# Patient Record
Sex: Male | Born: 1955
Health system: Southern US, Community
[De-identification: ages and names within clinical notes are randomized; demographics above are authoritative.]

## PROBLEM LIST (undated history)

## (undated) DIAGNOSIS — Z8709 Personal history of other diseases of the respiratory system: Secondary | ICD-10-CM

## (undated) DIAGNOSIS — R195 Other fecal abnormalities: Secondary | ICD-10-CM

## (undated) DIAGNOSIS — K440 Diaphragmatic hernia with obstruction, without gangrene: Secondary | ICD-10-CM

## (undated) DIAGNOSIS — K649 Unspecified hemorrhoids: Secondary | ICD-10-CM

## (undated) DIAGNOSIS — E785 Hyperlipidemia, unspecified: Secondary | ICD-10-CM

## (undated) DIAGNOSIS — S5002XA Contusion of left elbow, initial encounter: Secondary | ICD-10-CM

## (undated) DIAGNOSIS — G8929 Other chronic pain: Secondary | ICD-10-CM

## (undated) DIAGNOSIS — J9819 Other pulmonary collapse: Secondary | ICD-10-CM

## (undated) DIAGNOSIS — K219 Gastro-esophageal reflux disease without esophagitis: Secondary | ICD-10-CM

## (undated) DIAGNOSIS — R001 Bradycardia, unspecified: Secondary | ICD-10-CM

## (undated) DIAGNOSIS — Z86010 Personal history of colonic polyps: Secondary | ICD-10-CM

## (undated) DIAGNOSIS — R0789 Other chest pain: Secondary | ICD-10-CM

## (undated) DIAGNOSIS — I10 Essential (primary) hypertension: Secondary | ICD-10-CM

## (undated) HISTORY — DX: Other pulmonary collapse: J98.19

## (undated) HISTORY — DX: Unspecified hemorrhoids: K64.9

## (undated) HISTORY — DX: Personal history of other diseases of the respiratory system: Z87.09

## (undated) HISTORY — DX: Gastro-esophageal reflux disease without esophagitis: K21.9

## (undated) HISTORY — DX: Bradycardia, unspecified: R00.1

---
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
## 2009-02-04 HISTORY — PX: ESOPHAGOGASTRODUODENOSCOPY: SHX1529

## 2009-02-04 HISTORY — PX: COLONOSCOPY: SHX174

## 2015-11-09 DIAGNOSIS — E785 Hyperlipidemia, unspecified: Secondary | ICD-10-CM | POA: Insufficient documentation

## 2015-11-09 DIAGNOSIS — I10 Essential (primary) hypertension: Secondary | ICD-10-CM

## 2015-11-09 DIAGNOSIS — R0789 Other chest pain: Secondary | ICD-10-CM | POA: Insufficient documentation

## 2015-11-09 HISTORY — DX: Hyperlipidemia, unspecified: E78.5

## 2015-11-09 HISTORY — DX: Essential (primary) hypertension: I10

## 2015-11-09 HISTORY — DX: Other chest pain: R07.89

## 2017-01-24 DIAGNOSIS — G8929 Other chronic pain: Secondary | ICD-10-CM | POA: Insufficient documentation

## 2017-01-24 DIAGNOSIS — M25561 Pain in right knee: Secondary | ICD-10-CM

## 2017-01-24 DIAGNOSIS — M25562 Pain in left knee: Secondary | ICD-10-CM

## 2017-01-24 HISTORY — DX: Other chronic pain: G89.29

## 2018-07-24 DIAGNOSIS — S5002XA Contusion of left elbow, initial encounter: Secondary | ICD-10-CM

## 2018-07-24 HISTORY — DX: Contusion of left elbow, initial encounter: S50.02XA

## 2018-12-02 ENCOUNTER — Ambulatory Visit: Payer: Self-pay | Admitting: Surgery

## 2018-12-02 DIAGNOSIS — Z860100 Personal history of colon polyps, unspecified: Secondary | ICD-10-CM

## 2018-12-02 DIAGNOSIS — R195 Other fecal abnormalities: Secondary | ICD-10-CM | POA: Insufficient documentation

## 2018-12-02 DIAGNOSIS — K440 Diaphragmatic hernia with obstruction, without gangrene: Secondary | ICD-10-CM

## 2018-12-02 HISTORY — DX: Personal history of colonic polyps: Z86.010

## 2018-12-02 HISTORY — DX: Diaphragmatic hernia with obstruction, without gangrene: K44.0

## 2018-12-02 HISTORY — DX: Personal history of colon polyps, unspecified: Z86.0100

## 2018-12-02 HISTORY — DX: Other fecal abnormalities: R19.5

## 2018-12-02 NOTE — H&P (Signed)
Randy Larson Documented: 12/02/2018 8:44 AM Location: [facility] Patient #: [id number] DOB: Oct 04, 1955 Married / Language: Randy Larson / Race: White Male  History of Present Illness Adin Hector MD; 12/02/2018 1:07 PM) The patient is a 63 year old male who presents with a hiatal hernia. Note for "Hiatal hernia": ` ` ` Patient sent for surgical consultation at the request of Dr Andrez Grime, [place]  Chief Complaint: Giant hiatal hernia ` ` The patient is a pleasant gentleman comes in today with his wife. He had episode of pneumonia and was found to have a large hiatal hernia by chest x-ray. His gradually gotten larger. Patient has had heartburn and reflux issues. He is required proton pump inhibitors for almost 10 years. Gets symptoms of heartburn and reflux if he is not on that medication. Episode of severe chest pain that concerned him. Was evaluated by cardiology negative for any coronary major issues. History of colon polypsa colonoscopy by Dr. Lyndel Safe about 10 years ago. An endoscopy at that time that he recalls being told he had a hiatal hernia as well.  Patient has had some episodes of bloating and dysphagia. He's had a few times for his had to force himself to vomit. Usually sleeps on a pillow. He cannot lie down flat. He feels discomfort and reflux when he bends over. He also noticed that his bowels have changed. Requires more straining. Has not had a well-formed satisfying bowel movement in 6 months. Often has to strain to have some loose mushy bowel movements. Sometimes his stools arer black. Often hears some weird gurgling up in his chest and even upper abdomen after he eats. It concerns his wife. He can walk 30 minutes without any difficulty. He had some inguinal hernia surgery done by Dr. Pauletta Browns about 10 years ago. He's had some right groin pain ever since. Nothing too severe. He does struggle with significant back pain as well. Primarily  thoracic. I believe he seen orthopedic surgeon for this. No strong recommendations for surgery or injections. Patient was concerned that he has had some unintentional weight loss. He was 177 pounds and 2017. He is now 171. Patient notes he had gained weight into the bed 180s as of a year ago. Based on concerns of intermittent vomiting and unintentional weight loss within the setting of a giant hernia, surgical consultation recommended. Hiatal hernia confirmed. Given its giant size and complexity, patient referred to me for further evaluation.  No personal nor family history of GI/colon cancer, inflammatory bowel disease, irritable bowel syndrome, allergy such as Celiac Sprue, dietary/dairy problems, colitis, ulcers nor gastritis. No recent sick contacts/gastroenteritis. No travel outside the country. No changes in diet. No hematemesis, coffee ground emesis. No evidence of prior gastric/peptic ulceration.  (Review of systems as stated in this history (HPI) or in the review of systems. Otherwise all other 12 point ROS are negative) ` ` `   Diagnostic Studies History (Tanisha A. Owens Shark, [place]; 12/02/2018 8:44 AM) Colonoscopy 5-10 years ago  Allergies (Tanisha A. Owens Shark, [place]; 12/02/2018 8:45 AM) No Known Drug Allergies [12/02/2018]: Allergies Reconciled  Medication History (Tanisha A. Owens Shark, RMA; 12/02/2018 8:45 AM) Lisinopril (10MG  Tablet, Oral) Active. Pantoprazole Sodium (40MG  Tablet DR, Oral) Active. Medications Reconciled  Social History (Tanisha A. Owens Shark, [place]; 12/02/2018 8:44 AM) Alcohol use Moderate alcohol use. Caffeine use Carbonated beverages, Coffee. Tobacco use Former smoker.  Family History (Tanisha A. Owens Shark, [place]; 12/02/2018 8:44 AM) Diabetes Mellitus Mother, Sister. Hypertension Mother. Rectal Cancer Brother.  Other Problems (  Tanisha A. Owens Shark, [place]; 12/02/2018 8:44 AM) Anxiety Disorder Back Pain Gastroesophageal Reflux Disease Heart  murmur Hemorrhoids High blood pressure Umbilical Hernia Repair     Review of Systems (Tanisha A. Brown RMA; 12/02/2018 8:44 AM) General Present- Fatigue and Weight Loss. Not Present- Appetite Loss, Chills, Fever, Night Sweats and Weight Gain. Skin Present- Dryness. Not Present- Change in Wart/Mole, Hives, Jaundice, New Lesions, Non-Healing Wounds, Rash and Ulcer. HEENT Present- Ringing in the Ears. Not Present- Earache, Hearing Loss, Hoarseness, Nose Bleed, Oral Ulcers, Seasonal Allergies, Sinus Pain, Sore Throat, Visual Disturbances, Wears glasses/contact lenses and Yellow Eyes. Cardiovascular Present- Leg Cramps and Shortness of Breath. Not Present- Chest Pain, Difficulty Breathing Lying Down, Palpitations, Rapid Heart Rate and Swelling of Extremities. Gastrointestinal Present- Constipation, Difficulty Swallowing and Excessive gas. Not Present- Abdominal Pain, Bloating, Bloody Stool, Change in Bowel Habits, Chronic diarrhea, Gets full quickly at meals, Hemorrhoids, Indigestion, Nausea, Rectal Pain and Vomiting. Male Genitourinary Present- Impotence. Not Present- Blood in Urine, Change in Urinary Stream, Frequency, Nocturia, Painful Urination, Urgency and Urine Leakage. Musculoskeletal Present- Back Pain and Muscle Weakness. Not Present- Joint Pain, Joint Stiffness, Muscle Pain and Swelling of Extremities. Neurological Present- Trouble walking and Weakness. Not Present- Decreased Memory, Fainting, Headaches, Numbness, Seizures, Tingling and Tremor. Psychiatric Present- Anxiety and Change in Sleep Pattern. Not Present- Bipolar, Depression, Fearful and Frequent crying. Hematology Present- Easy Bruising. Not Present- Blood Thinners, Excessive bleeding, Gland problems, HIV and Persistent Infections.  Vitals (Tanisha A. Brown RMA; 12/02/2018 8:45 AM) 12/02/2018 8:45 AM Weight: 171.4 lb Height: 71in Body Surface Area: 1.98 m Body Mass Index: 23.91 kg/m  Temp.: 98.22F  BP: 122/84  (Sitting, Left Arm, Standard)      Physical Exam Adin Hector MD; 12/02/2018 1:06 PM)  General Mental Status-Alert. General Appearance-Not in acute distress, Not Sickly. Orientation-Oriented X3. Hydration-Well hydrated. Voice-Normal.  Integumentary Global Assessment Upon inspection and palpation of skin surfaces of the - Axillae: non-tender, no inflammation or ulceration, no drainage. and Distribution of scalp and body hair is normal. General Characteristics Temperature - normal warmth is noted.  Head and Neck Head-normocephalic, atraumatic with no lesions or palpable masses. Face Global Assessment - atraumatic, no absence of expression. Neck Global Assessment - no abnormal movements, no bruit auscultated on the right, no bruit auscultated on the left, no decreased range of motion, non-tender. Trachea-midline. Thyroid Gland Characteristics - non-tender.  Eye Eyeball - Left-Extraocular movements intact, No Nystagmus. Eyeball - Right-Extraocular movements intact, No Nystagmus. Cornea - Left-No Hazy. Cornea - Right-No Hazy. Sclera/Conjunctiva - Left-No scleral icterus, No Discharge. Sclera/Conjunctiva - Right-No scleral icterus, No Discharge. Pupil - Left-Direct reaction to light normal. Pupil - Right-Direct reaction to light normal.  ENMT Ears Pinna - Left - no drainage observed, no generalized tenderness observed. Right - no drainage observed, no generalized tenderness observed. Nose and Sinuses External Inspection of the Nose - no destructive lesion observed. Inspection of the nares - Left - quiet respiration. Right - quiet respiration. Mouth and Throat Lips - Upper Lip - no fissures observed, no pallor noted. Lower Lip - no fissures observed, no pallor noted. Nasopharynx - no discharge present. Oral Cavity/Oropharynx - Tongue - no dryness observed. Oral Mucosa - no cyanosis observed. Hypopharynx - no evidence of airway distress  observed.  Chest and Lung Exam Inspection Movements - Normal and Symmetrical. Accessory muscles - No use of accessory muscles in breathing. Palpation Palpation of the chest reveals - Non-tender. Auscultation Breath sounds - Normal and Clear.  Cardiovascular Auscultation Rhythm -  Regular. Murmurs & Other Heart Sounds - Auscultation of the heart reveals - No Murmurs and No Systolic Clicks.  Abdomen Inspection Inspection of the abdomen reveals - No Visible peristalsis and No Abnormal pulsations. Umbilicus - No Bleeding, No Urine drainage. Palpation/Percussion Palpation and Percussion of the abdomen reveal - Soft, Non Tender, No Rebound tenderness, No Rigidity (guarding) and No Cutaneous hyperesthesia. Note: Abdomen soft. Nontender. Not distended. No umbilical or incisional hernias. No guarding.  Male Genitourinary Sexual Maturity Tanner 5 - Adult hair pattern and Adult penile size and shape. Note: Mild right groin discomfort but no definite hernia. More subtle impulse on the left side suspicious for small recurrent left inguinal hernia. Otherwise normal external male genitalia.  Peripheral Vascular Upper Extremity Inspection - Left - No Cyanotic nailbeds, Not Ischemic. Right - No Cyanotic nailbeds, Not Ischemic.  Neurologic Neurologic evaluation reveals -normal attention span and ability to concentrate, able to name objects and repeat phrases. Appropriate fund of knowledge , normal sensation and normal coordination. Mental Status Affect - not angry, not paranoid. Cranial Nerves-Normal Bilaterally. Gait-Normal.  Neuropsychiatric Mental status exam performed with findings of-able to articulate well with normal speech/language, rate, volume and coherence, thought content normal with ability to perform basic computations and apply abstract reasoning and no evidence of hallucinations, delusions, obsessions or homicidal/suicidal ideation.  Musculoskeletal Global  Assessment Spine, Ribs and Pelvis - no instability, subluxation or laxity. Right Upper Extremity - no instability, subluxation or laxity.  Lymphatic Head & Neck  General Head & Neck Lymphatics: Bilateral - Description - No Localized lymphadenopathy. Axillary  General Axillary Region: Bilateral - Description - No Localized lymphadenopathy. Femoral & Inguinal  Generalized Femoral & Inguinal Lymphatics: Left - Description - No Localized lymphadenopathy. Right - Description - No Localized lymphadenopathy.    Assessment & Plan Adin Hector MD; 12/02/2018 1:10 PM)  INCARCERATED HIATAL HERNIA (K44.0) Impression: Giant hiatal hernia with all his stomach, part of his transverse colon, and even part of his pancreas going up into it. The defect itself not particularly large.  Because of symptoms of heartburn, dysphagia, chest pain, dark stools, bowel changes; I think he would benefit from surgery to fix this.  Reasonable minimally invasive approach. I intended to do these robotically. Primary closure over pledgets. Absorbable Phasix mesh reinforcement. Given its large size with multiple organs involved, I suspect this will take longer than average despite his thin body habitus and lack of prior numerous surgeries. We will see. I discussed the technique at length with the patient. Wife more anxious than him about it. Tried to reassure her. I did caution him that people can have issues with chest wall discomfort and shoulder discomfort after surgery. Unguarded again saying that his thoracic back pain is caused by this but in the long run it should improve. Would defer to Dr. Ronnie Derby who with orthopedics that is seen him about this.  Preoperative manometry to get a sense of esophageal motility make sure that is not an issue given his history of dysphagia. Would prefer to do a more standard Nissen fundoplication his motility can handle it for more durable repair  Did discuss risks of gastric bloating and  inability to vomit. His risk of recurrence is increased given the large size. However he is thin and does not smoke. Hopefully we can minimize the risk. I did note it'll take several months for his diet to gradually advance, but it should gradually improve. He didn't like the idea of not eating steaks right away, but  he understood the reasoning. This hiatal hernia it is quite large already and it will only get worse. They are interested in proceeding. We'll work to coordinate this at a convenient time.  Current Plans You are being scheduled for surgery- Our schedulers will call you.  You should hear from our office's scheduling department within 5 working days about the location, date, and time of surgery. We try to make accommodations for patient's preferences in scheduling surgery, but sometimes the OR schedule or the surgeon's schedule prevents [place] from making those accommodations.  If you have not heard from our office [phone]) in 5 working days, call the office and ask for your surgeon's nurse.  If you have other questions about your diagnosis, plan, or surgery, call the office and ask for your surgeon's nurse.  The anatomy & physiology of the foregut and anti-reflux mechanism was discussed. The pathophysiology of hiatal herniation and GERD was discussed. Natural history risks without surgery was discussed. The patient's symptoms are not adequately controlled by medicines and other non-operative treatments. I feel the risks of no intervention will lead to serious problems that outweigh the operative risks; therefore, I recommended surgery to reduce the hiatal hernia out of the chest and fundoplication to rebuild the anti-reflux valve and control reflux better. Need for a thorough workup to rule out the differential diagnosis and plan treatment was explained. I explained laparoscopic techniques with possible need for an open approach.  Risks such as bleeding, infection, abscess, leak,  need for further treatment, heart attack, death, and other risks were discussed. I noted a good likelihood this will help address the problem. Goals of post-operative recovery were discussed as well. Possibility that this will not correct all symptoms was explained. Post-operative dysphagia, need for short-term liquid & pureed diet, inability to vomit, possibility of reherniation, possible need for medicines to help control symptoms in addition to surgery were discussed. We will work to minimize complications. Educational handouts further explaining the pathology, treatment options, and dysphagia diet was given as well. Questions were answered. The patient expresses understanding & wishes to proceed with surgery.   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 Pt Education - [facility] Laparoscopic Surgery HCI  CHRONIC GROIN PAIN, RIGHT (R10.31) Impression: Chronic right groin pain after open inguinal hernia repair by Dr. Reita May over 10 years ago. He could have hernia recurrence, but nothing obvious on the right side. Actually, I thought I felt something to be in the left side. I recommended groin stretches, anti-inflammatories, Ice / Heat. I would table on any inguinal hernia issues until he gets over his hiatal hernia surgery and has his colonoscopy done since they are more pressing issues.  Current Plans Pt Education - [facility] Pain Control (Rhayne Chatwin)  CHRONIC CONSTIPATION (K59.09) Impression: Recommended fiber supplement every day. Maybe at least contributed by the transverse colon incarcerated up in the hiatal hernia causing an obvious partial transition point on CT scan.  Current Plans Pt Education - [facility] Constipation (AT) Pt Education - [facility] Good Bowel Health (Maronda Caison)  HISTORY OF ADENOMATOUS POLYP OF COLON (Z86.010) Impression: Agree with plan with follow-up colonoscopy given his  bowel changes and occasional melena. For likely is due to his hiatal hernia causing Cameron ulcerations but he is overdue for colonoscopy.  Could consider doing a preoperatively but they may hesitate since part of his transverse colon is incarcerated up in his chest. It might be better to  do the hiatal hernia surgery first and then plan colonoscopy 6-12 weeks later.  Current Plans Pt Education - Polyps in the Colon and Rectum (Colonic and Rectal Polyps): colonic polyps  Adin Hector, MD, FACS, MASCRS Gastrointestinal and Minimally Invasive Surgery    [address], [place], [place] [address] [phone] Main / Paging [phone] Fax

## 2018-12-02 NOTE — H&P (Signed)
Randy Larson Documented: 12/02/2018 8:44 AM Location: [facility] Patient #: [id number] DOB: November 04, 1955 Married / Language: Randy Larson / Race: White Male  History of Present Illness Randy Hector MD; 12/02/2018 1:07 PM) The patient is a 63 year old male who presents with a hiatal hernia. Note for "Hiatal hernia": ` ` ` Patient sent for surgical consultation at the request of Randy Larson, [place]  Chief Complaint: Giant hiatal hernia ` ` The patient is a pleasant gentleman comes in today with his wife. He had episode of pneumonia and was found to have a large hiatal hernia by chest x-ray. His gradually gotten larger. Patient has had heartburn and reflux issues. He is required proton pump inhibitors for almost 10 years. Gets symptoms of heartburn and reflux if he is not on that medication. Episode of severe chest pain that concerned him. Was evaluated by cardiology negative for any coronary major issues. History of colon polypsa colonoscopy by Randy Larson about 10 years ago. An endoscopy at that time that he recalls being told he had a hiatal hernia as well.  Patient has had some episodes of bloating and dysphagia. He's had a few times for his had to force himself to vomit. Usually sleeps on a pillow. He cannot lie down flat. He feels discomfort and reflux when he bends over. He also noticed that his bowels have changed. Requires more straining. Has not had a well-formed satisfying bowel movement in 6 months. Often has to strain to have some loose mushy bowel movements. Sometimes his stools arer black. Often hears some weird gurgling up in his chest and even upper abdomen after he eats. It concerns his wife. He can walk 30 minutes without any difficulty. He had some inguinal hernia surgery done by Randy Larson about 10 years ago. He's had some right groin pain ever since. Nothing too severe. He does struggle with significant back pain as well. Primarily  thoracic. I believe he seen orthopedic surgeon for this. No strong recommendations for surgery or injections. Patient was concerned that he has had some unintentional weight loss. He was 177 pounds and 2017. He is now 171. Patient notes he had gained weight into the bed 180s as of a year ago. Based on concerns of intermittent vomiting and unintentional weight loss within the setting of a giant hernia, surgical consultation recommended. Hiatal hernia confirmed. Given its giant size and complexity, patient referred to me for further evaluation.  No personal nor family history of GI/colon cancer, inflammatory bowel disease, irritable bowel syndrome, allergy such as Celiac Sprue, dietary/dairy problems, colitis, ulcers nor gastritis. No recent sick contacts/gastroenteritis. No travel outside the country. No changes in diet. No hematemesis, coffee ground emesis. No evidence of prior gastric/peptic ulceration.  (Review of systems as stated in this history (HPI) or in the review of systems. Otherwise all other 12 point ROS are negative) ` ` `   Diagnostic Studies History (Randy Larson, [place]; 12/02/2018 8:44 AM) Colonoscopy 5-10 years ago  Allergies (Randy Larson, [place]; 12/02/2018 8:45 AM) No Known Drug Allergies [12/02/2018]: Allergies Reconciled  Medication History (Randy Larson, RMA; 12/02/2018 8:45 AM) Lisinopril (10MG  Tablet, Oral) Active. Pantoprazole Sodium (40MG  Tablet Randy, Oral) Active. Medications Reconciled  Social History (Randy Larson, [place]; 12/02/2018 8:44 AM) Alcohol use Moderate alcohol use. Caffeine use Carbonated beverages, Coffee. Tobacco use Former smoker.  Family History (Randy Larson, [place]; 12/02/2018 8:44 AM) Diabetes Mellitus Mother, Sister. Hypertension Mother. Rectal Cancer Brother.  Other Problems (  Randy Larson, [place]; 12/02/2018 8:44 AM) Anxiety Disorder Back Pain Gastroesophageal Reflux Disease Heart  murmur Hemorrhoids High blood pressure Umbilical Hernia Repair     Review of Systems (Randy Larson RMA; 12/02/2018 8:44 AM) General Present- Fatigue and Weight Loss. Not Present- Appetite Loss, Chills, Fever, Night Sweats and Weight Gain. Skin Present- Dryness. Not Present- Change in Wart/Mole, Hives, Jaundice, New Lesions, Non-Healing Wounds, Rash and Ulcer. HEENT Present- Ringing in the Ears. Not Present- Earache, Hearing Loss, Hoarseness, Nose Bleed, Oral Ulcers, Seasonal Allergies, Sinus Pain, Sore Throat, Visual Disturbances, Wears glasses/contact lenses and Yellow Eyes. Cardiovascular Present- Leg Cramps and Shortness of Breath. Not Present- Chest Pain, Difficulty Breathing Lying Down, Palpitations, Rapid Heart Rate and Swelling of Extremities. Gastrointestinal Present- Constipation, Difficulty Swallowing and Excessive gas. Not Present- Abdominal Pain, Bloating, Bloody Stool, Change in Bowel Habits, Chronic diarrhea, Gets full quickly at meals, Hemorrhoids, Indigestion, Nausea, Rectal Pain and Vomiting. Male Genitourinary Present- Impotence. Not Present- Blood in Urine, Change in Urinary Stream, Frequency, Nocturia, Painful Urination, Urgency and Urine Leakage. Musculoskeletal Present- Back Pain and Muscle Weakness. Not Present- Joint Pain, Joint Stiffness, Muscle Pain and Swelling of Extremities. Neurological Present- Trouble walking and Weakness. Not Present- Decreased Memory, Fainting, Headaches, Numbness, Seizures, Tingling and Tremor. Psychiatric Present- Anxiety and Change in Sleep Pattern. Not Present- Bipolar, Depression, Fearful and Frequent crying. Hematology Present- Easy Bruising. Not Present- Blood Thinners, Excessive bleeding, Gland problems, HIV and Persistent Infections.  Vitals (Randy Larson RMA; 12/02/2018 8:45 AM) 12/02/2018 8:45 AM Weight: 171.4 lb Height: 71in Body Surface Area: 1.98 m Body Mass Index: 23.91 kg/m  Temp.: 98.30F  BP: 122/84  (Sitting, Left Arm, Standard)      Physical Exam Randy Hector MD; 12/02/2018 1:06 PM)  General Mental Status-Alert. General Appearance-Not in acute distress, Not Sickly. Orientation-Oriented X3. Hydration-Well hydrated. Voice-Normal.  Integumentary Global Assessment Upon inspection and palpation of skin surfaces of the - Axillae: non-tender, no inflammation or ulceration, no drainage. and Distribution of scalp and body hair is normal. General Characteristics Temperature - normal warmth is noted.  Head and Neck Head-normocephalic, atraumatic with no lesions or palpable masses. Face Global Assessment - atraumatic, no absence of expression. Neck Global Assessment - no abnormal movements, no bruit auscultated on the right, no bruit auscultated on the left, no decreased range of motion, non-tender. Trachea-midline. Thyroid Gland Characteristics - non-tender.  Eye Eyeball - Left-Extraocular movements intact, No Nystagmus. Eyeball - Right-Extraocular movements intact, No Nystagmus. Cornea - Left-No Hazy. Cornea - Right-No Hazy. Sclera/Conjunctiva - Left-No scleral icterus, No Discharge. Sclera/Conjunctiva - Right-No scleral icterus, No Discharge. Pupil - Left-Direct reaction to light normal. Pupil - Right-Direct reaction to light normal.  ENMT Ears Pinna - Left - no drainage observed, no generalized tenderness observed. Right - no drainage observed, no generalized tenderness observed. Nose and Sinuses External Inspection of the Nose - no destructive lesion observed. Inspection of the nares - Left - quiet respiration. Right - quiet respiration. Mouth and Throat Lips - Upper Lip - no fissures observed, no pallor noted. Lower Lip - no fissures observed, no pallor noted. Nasopharynx - no discharge present. Oral Cavity/Oropharynx - Tongue - no dryness observed. Oral Mucosa - no cyanosis observed. Hypopharynx - no evidence of airway distress  observed.  Chest and Lung Exam Inspection Movements - Normal and Symmetrical. Accessory muscles - No use of accessory muscles in breathing. Palpation Palpation of the chest reveals - Non-tender. Auscultation Breath sounds - Normal and Clear.  Cardiovascular Auscultation Rhythm -  Regular. Murmurs & Other Heart Sounds - Auscultation of the heart reveals - No Murmurs and No Systolic Clicks.  Abdomen Inspection Inspection of the abdomen reveals - No Visible peristalsis and No Abnormal pulsations. Umbilicus - No Bleeding, No Urine drainage. Palpation/Percussion Palpation and Percussion of the abdomen reveal - Soft, Non Tender, No Rebound tenderness, No Rigidity (guarding) and No Cutaneous hyperesthesia. Note: Abdomen soft. Nontender. Not distended. No umbilical or incisional hernias. No guarding.  Male Genitourinary Sexual Maturity Tanner 5 - Adult hair pattern and Adult penile size and shape. Note: Mild right groin discomfort but no definite hernia. More subtle impulse on the left side suspicious for small recurrent left inguinal hernia. Otherwise normal external male genitalia.  Peripheral Vascular Upper Extremity Inspection - Left - No Cyanotic nailbeds, Not Ischemic. Right - No Cyanotic nailbeds, Not Ischemic.  Neurologic Neurologic evaluation reveals -normal attention span and ability to concentrate, able to name objects and repeat phrases. Appropriate fund of knowledge , normal sensation and normal coordination. Mental Status Affect - not angry, not paranoid. Cranial Nerves-Normal Bilaterally. Gait-Normal.  Neuropsychiatric Mental status exam performed with findings of-able to articulate well with normal speech/language, rate, volume and coherence, thought content normal with ability to perform basic computations and apply abstract reasoning and no evidence of hallucinations, delusions, obsessions or homicidal/suicidal ideation.  Musculoskeletal Global  Assessment Spine, Ribs and Pelvis - no instability, subluxation or laxity. Right Upper Extremity - no instability, subluxation or laxity.  Lymphatic Head & Neck  General Head & Neck Lymphatics: Bilateral - Description - No Localized lymphadenopathy. Axillary  General Axillary Region: Bilateral - Description - No Localized lymphadenopathy. Femoral & Inguinal  Generalized Femoral & Inguinal Lymphatics: Left - Description - No Localized lymphadenopathy. Right - Description - No Localized lymphadenopathy.    Assessment & Plan Randy Hector MD; 12/02/2018 1:10 PM)  INCARCERATED HIATAL HERNIA (K44.0) Impression: Giant hiatal hernia with all his stomach, part of his transverse colon, and even part of his pancreas going up into it. The defect itself not particularly large.  Because of symptoms of heartburn, dysphagia, chest pain, dark stools, bowel changes; I think he would benefit from surgery to fix this.  Reasonable minimally invasive approach. I intended to do these robotically. Primary closure over pledgets. Absorbable Phasix mesh reinforcement. Given its large size with multiple organs involved, I suspect this will take longer than average despite his thin body habitus and lack of prior numerous surgeries. We will see. I discussed the technique at length with the patient. Wife more anxious than him about it. Tried to reassure her. I did caution him that people can have issues with chest wall discomfort and shoulder discomfort after surgery. Unguarded again saying that his thoracic back pain is caused by this but in the long run it should improve. Would defer to Randy. Ronnie Larson who with orthopedics that is seen him about this.  Preoperative manometry to get a sense of esophageal motility make sure that is not an issue given his history of dysphagia. Would prefer to do a more standard Nissen fundoplication his motility can handle it for more durable repair  Did discuss risks of gastric bloating and  inability to vomit. His risk of recurrence is increased given the large size. However he is thin and does not smoke. Hopefully we can minimize the risk. I did note it'll take several months for his diet to gradually advance, but it should gradually improve. He didn't like the idea of not eating steaks right away, but  he understood the reasoning. This hiatal hernia it is quite large already and it will only get worse. They are interested in proceeding. We'll work to coordinate this at a convenient time.  Current Plans You are being scheduled for surgery- Our schedulers will call you.  You should hear from our office's scheduling department within 5 working days about the location, date, and time of surgery. We try to make accommodations for patient's preferences in scheduling surgery, but sometimes the OR schedule or the surgeon's schedule prevents [place] from making those accommodations.  If you have not heard from our office [phone]) in 5 working days, call the office and ask for your surgeon's nurse.  If you have other questions about your diagnosis, plan, or surgery, call the office and ask for your surgeon's nurse.  The anatomy & physiology of the foregut and anti-reflux mechanism was discussed. The pathophysiology of hiatal herniation and GERD was discussed. Natural history risks without surgery was discussed. The patient's symptoms are not adequately controlled by medicines and other non-operative treatments. I feel the risks of no intervention will lead to serious problems that outweigh the operative risks; therefore, I recommended surgery to reduce the hiatal hernia out of the chest and fundoplication to rebuild the anti-reflux valve and control reflux better. Need for a thorough workup to rule out the differential diagnosis and plan treatment was explained. I explained laparoscopic techniques with possible need for an open approach.  Risks such as bleeding, infection, abscess, leak,  need for further treatment, heart attack, death, and other risks were discussed. I noted a good likelihood this will help address the problem. Goals of post-operative recovery were discussed as well. Possibility that this will not correct all symptoms was explained. Post-operative dysphagia, need for short-term liquid & pureed diet, inability to vomit, possibility of reherniation, possible need for medicines to help control symptoms in addition to surgery were discussed. We will work to minimize complications. Educational handouts further explaining the pathology, treatment options, and dysphagia diet was given as well. Questions were answered. The patient expresses understanding & wishes to proceed with surgery.   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 Pt Education - [facility] Laparoscopic Surgery HCI  CHRONIC GROIN PAIN, RIGHT (R10.31) Impression: Chronic right groin pain after open inguinal hernia repair by Randy Larson over 10 years ago. He could have hernia recurrence, but nothing obvious on the right side. Actually, I thought I felt something to be in the left side. I recommended groin stretches, anti-inflammatories, Ice / Heat. I would table on any inguinal hernia issues until he gets over his hiatal hernia surgery and has his colonoscopy done since they are more pressing issues.  Current Plans Pt Education - [facility] Pain Control (Randy Larson)  CHRONIC CONSTIPATION (K59.09) Impression: Recommended fiber supplement every day. Maybe at least contributed by the transverse colon incarcerated up in the hiatal hernia causing an obvious partial transition point on CT scan.  Current Plans Pt Education - [facility] Constipation (AT) Pt Education - [facility] Good Bowel Health (Randy Larson)  HISTORY OF ADENOMATOUS POLYP OF COLON (Z86.010) Impression: Agree with plan with follow-up colonoscopy given his  bowel changes and occasional melena. For likely is due to his hiatal hernia causing Cameron ulcerations but he is overdue for colonoscopy.  Could consider doing a preoperatively but they Larson hesitate since part of his transverse colon is incarcerated up in his chest. It might be better to  do the hiatal hernia surgery first and then plan colonoscopy 6-12 weeks later.  Current Plans Pt Education - Polyps in the Colon and Rectum (Colonic and Rectal Polyps): colonic polyps  Randy Hector, MD, FACS, MASCRS Gastrointestinal and Minimally Invasive Surgery    [address], [place], [place] [address] [phone] Main / Paging [phone] Fax

## 2018-12-09 ENCOUNTER — Telehealth: Payer: Self-pay | Admitting: Gastroenterology

## 2018-12-09 DIAGNOSIS — K219 Gastro-esophageal reflux disease without esophagitis: Secondary | ICD-10-CM

## 2018-12-09 NOTE — Telephone Encounter (Signed)
Dr. Johney Maine sent an urgent referral for pt to Dr. Lyndel Safe for a manometry in order to get pt scheduled for surgery.

## 2018-12-10 NOTE — Telephone Encounter (Signed)
Dr. Lyndel Safe, please elaborate on what this patient needs: (what I need to order) Or is it just a referral to Dr. Johney Maine

## 2018-12-11 NOTE — Telephone Encounter (Signed)
I have reviewed Dr Clyda Greener note in detail.  Pt with large hiatal hernia causing significant symptoms and at risk for volvulus- needs hiatal hernia repair sooner than later.  Having dysphagia.  Need to rule out any esophageal disorder prior to surgery if possible.  Looks like he may have had EGD by Dr. Lilia Pro at [facility] already.    Plan: -Pl set him up for esophageal manometry at [facility] as soon as possible (Dr Silverio Decamp to read).  Send copy of manometry to Dr. Johney Maine as well.  Does not need pH study. -Follow-up 8 weeks after surgery in the GI clinic with me.  Would set him up for colonoscopy thereafter.

## 2018-12-11 NOTE — Telephone Encounter (Signed)
Left message on machine to call back  

## 2018-12-15 NOTE — Telephone Encounter (Signed)
The pt was in [place] and will call back for instructions   You have been scheduled for an esophageal manometry at [facility] on 12/22/18 at 830 am. Please arrive 30 minutes prior to your procedure for registration. You will need to go to outpatient registration (1st floor of the hospital) first. Make certain to bring your insurance cards as well as a complete list of medications.  Please remember the following:  1) Do not take any muscle relaxants, xanax (alprazolam) or ativan for 1 day prior to your test as well as the day of the test.  2) Nothing to eat or drink for 4 hours before your test.  3) Hold all diabetic medications/insulin the morning of the test. You may eat and take your medications after the test.  It will take at least 2 weeks to receive the results of this test from your physician. ------------------------------------------ ABOUT ESOPHAGEAL MANOMETRY Esophageal manometry (muh-NOM-uh-tree) is a test that gauges how well your esophagus works. Your esophagus is the long, muscular tube that connects your throat to your stomach. Esophageal manometry measures the rhythmic muscle contractions (peristalsis) that occur in your esophagus when you swallow. Esophageal manometry also measures the coordination and force exerted by the muscles of your esophagus.  During esophageal manometry, a thin, flexible tube (catheter) that contains sensors is passed through your nose, down your esophagus and into your stomach. Esophageal manometry can be helpful in diagnosing some mostly uncommon disorders that affect your esophagus.  Why it's done Esophageal manometry is used to evaluate the movement (motility) of food through the esophagus and into the stomach. The test measures how well the circular bands of muscle (sphincters) at the top and bottom of your esophagus open and close, as well as the pressure, strength and pattern of the wave of esophageal muscle contractions that moves food  along.  What you can expect Esophageal manometry is an outpatient procedure done without sedation. Most people tolerate it well. You may be asked to change into a hospital gown before the test starts.  During esophageal manometry  . While you are sitting up, a member of your health care team sprays your throat with a numbing medication or puts numbing gel in your nose or both.  . A catheter is guided through your nose into your esophagus. The catheter may be sheathed in a water-filled sleeve. It doesn't interfere with your breathing. However, your eyes may water, and you may gag. You may have a slight nosebleed from irritation.  . After the catheter is in place, you may be asked to lie on your back on an exam table, or you may be asked to remain seated.  . You then swallow small sips of water. As you do, a computer connected to the catheter records the pressure, strength and pattern of your esophageal muscle contractions.  . During the test, you'll be asked to breathe slowly and smoothly, remain as still as possible, and swallow only when you're asked to do so.  . A member of your health care team may move the catheter down into your stomach while the catheter continues its measurements.  . The catheter then is slowly withdrawn. The test usually lasts 20 to 30 minutes.  After esophageal manometry  When your esophageal manometry is complete, you may return to your normal activities  This test typically takes 30-45 minutes to complete. ________________________________________________________________________________

## 2018-12-15 NOTE — Telephone Encounter (Signed)
The pt was rescheduled to 3/25 The pt has been advised of the information and verbalized understanding.

## 2019-01-16 NOTE — Telephone Encounter (Signed)
A questions answered about mano.  New instructions mailed to the patient.

## 2019-01-16 NOTE — Telephone Encounter (Signed)
Patient is confused about why he is having a procedure at [facility] and also with DR. Gross. Would like more information for his procedure at [facility] on 6-22

## 2019-02-05 ENCOUNTER — Other Ambulatory Visit: Payer: Self-pay

## 2019-02-09 ENCOUNTER — Ambulatory Visit: Payer: Self-pay | Admitting: Surgery

## 2019-02-16 ENCOUNTER — Telehealth: Payer: Self-pay

## 2019-02-16 ENCOUNTER — Telehealth: Payer: Self-pay | Admitting: Gastroenterology

## 2019-02-16 NOTE — Telephone Encounter (Signed)
Most recent Procedure notes and path reports are on your desk.

## 2019-02-16 NOTE — Telephone Encounter (Signed)
Patient is aware. He thanks you for the reassurance.

## 2019-02-16 NOTE — Telephone Encounter (Signed)
Patient of Dr Lyndel Safe. The patient wants to be certain he is still considered a patient of Dr Lyndel Safe. Wishes to transfer his care from Dr Steve Rattler previous location to the present location. He asks when he is due a screening colonoscopy.  Thanks

## 2019-02-16 NOTE — Telephone Encounter (Signed)
Dr Silverio Decamp will you be available to read the manometry after it is done on 03/23/19? His surgery is scheduled for 03/27/19 with Dr Johney Maine.

## 2019-02-16 NOTE — Telephone Encounter (Signed)
Yes, aware of it. Thanks

## 2019-02-17 NOTE — Telephone Encounter (Signed)
Was due 01/2012 Pl make televist appt

## 2019-02-19 NOTE — Telephone Encounter (Signed)
Patient scheduled for Virtual visit on 03/19/2019 at 8:30am with Dr. Lyndel Safe to set up his repeat colonoscopy.

## 2019-03-05 ENCOUNTER — Other Ambulatory Visit: Payer: Self-pay

## 2019-03-12 ENCOUNTER — Other Ambulatory Visit: Payer: Self-pay

## 2019-03-12 DIAGNOSIS — Z1159 Encounter for screening for other viral diseases: Secondary | ICD-10-CM

## 2019-03-13 ENCOUNTER — Telehealth: Payer: Self-pay | Admitting: Gastroenterology

## 2019-03-13 NOTE — Telephone Encounter (Signed)
Patient called to wanted to discuss the letter he got that stated that he had to be tested for the covid before his procedure. He is wanting to discuss some things with the nurse.

## 2019-03-13 NOTE — Telephone Encounter (Signed)
Patient is not able to quarantine for the COVID screen.  He asks that I cancel the manometry for now.  He will call back when he is ready to reschedule. Mano cancelled

## 2019-03-13 NOTE — Telephone Encounter (Signed)
That is fine We understand Thanks for letting me know  RG

## 2019-03-17 ENCOUNTER — Encounter: Payer: Self-pay | Admitting: Gastroenterology

## 2019-03-17 NOTE — Progress Notes (Signed)
CALLED SYLVIA AT [facility] AND MADE AWARE PATIENT REFUSING TO QUARANTINE AFTER COVID TEST, PER BEVERLY HARRELSON PATIENT CANNOT HAVE SURGERY IF DOES NOT QUARANTINE AND TO INFORM DR GROSS OF THIS.

## 2019-03-19 ENCOUNTER — Telehealth: Payer: 59 | Admitting: Gastroenterology

## 2019-03-19 ENCOUNTER — Other Ambulatory Visit (HOSPITAL_COMMUNITY): Payer: 59

## 2019-03-19 ENCOUNTER — Other Ambulatory Visit: Payer: Self-pay

## 2019-03-23 ENCOUNTER — Ambulatory Visit (HOSPITAL_COMMUNITY): Admit: 2019-03-23 | Payer: 59 | Admitting: Gastroenterology

## 2019-03-23 ENCOUNTER — Encounter (HOSPITAL_COMMUNITY): Payer: Self-pay

## 2019-03-23 SURGERY — MANOMETRY, ESOPHAGUS
Anesthesia: Choice

## 2019-03-27 ENCOUNTER — Ambulatory Visit: Admit: 2019-03-27 | Payer: 59 | Admitting: Surgery

## 2019-03-27 SURGERY — FUNDOPLICATION, NISSEN, ROBOT-ASSISTED, LAPAROSCOPIC
Anesthesia: General

## 2019-04-01 ENCOUNTER — Other Ambulatory Visit: Payer: Self-pay | Admitting: *Deleted

## 2019-04-01 ENCOUNTER — Encounter: Payer: Self-pay | Admitting: *Deleted

## 2019-04-01 ENCOUNTER — Telehealth: Payer: Self-pay | Admitting: Gastroenterology

## 2019-04-01 DIAGNOSIS — Z1159 Encounter for screening for other viral diseases: Secondary | ICD-10-CM

## 2019-04-01 DIAGNOSIS — K219 Gastro-esophageal reflux disease without esophagitis: Secondary | ICD-10-CM

## 2019-04-01 NOTE — Telephone Encounter (Signed)
Spoke to the patient, the patient has been rescheduled for an Esophageal Manometry at [facility] on Mon 04/13/2019 at 12:30 pm (CASE ID: [id number]). This RN review all instructions with the patient, verbalized understanding, a letter was sent to the patient with instructions. COVID-19 order and screening appt (Thurs 04/09/2019) in Epic, reminder letter for COVID-19 appt screening sent as well,

## 2019-04-01 NOTE — Telephone Encounter (Signed)
Pt would like to reschedule esophageal manometry.

## 2019-04-07 ENCOUNTER — Telehealth: Payer: Self-pay | Admitting: Gastroenterology

## 2019-04-07 NOTE — Telephone Encounter (Signed)
This RN did not call the patient yesterday, this RN last spoke with the patient on 04/01/2019.

## 2019-04-09 ENCOUNTER — Other Ambulatory Visit (HOSPITAL_COMMUNITY)
Admission: RE | Admit: 2019-04-09 | Discharge: 2019-04-09 | Disposition: A | Discharge status: Home / Self Care | Payer: 59 | Source: Ambulatory Visit | Attending: Gastroenterology | Admitting: Gastroenterology

## 2019-04-09 DIAGNOSIS — Z01812 Encounter for preprocedural laboratory examination: Secondary | ICD-10-CM | POA: Diagnosis present

## 2019-04-09 DIAGNOSIS — Z1159 Encounter for screening for other viral diseases: Secondary | ICD-10-CM | POA: Insufficient documentation

## 2019-04-10 LAB — SARS CORONAVIRUS 2 (TAT 6-24 HRS): SARS Coronavirus 2: NEGATIVE

## 2019-04-13 ENCOUNTER — Encounter (HOSPITAL_COMMUNITY)
Admission: RE | Disposition: A | Discharge status: Home / Self Care | Payer: Self-pay | Source: Ambulatory Visit | Attending: Gastroenterology

## 2019-04-13 ENCOUNTER — Ambulatory Visit (HOSPITAL_COMMUNITY)
Admission: RE | Admit: 2019-04-13 | Discharge: 2019-04-13 | Disposition: A | Discharge status: Home / Self Care | Payer: 59 | Source: Ambulatory Visit | Attending: Gastroenterology | Admitting: Gastroenterology

## 2019-04-13 DIAGNOSIS — K219 Gastro-esophageal reflux disease without esophagitis: Secondary | ICD-10-CM | POA: Insufficient documentation

## 2019-04-13 DIAGNOSIS — K449 Diaphragmatic hernia without obstruction or gangrene: Secondary | ICD-10-CM | POA: Diagnosis not present

## 2019-04-13 HISTORY — PX: ESOPHAGEAL MANOMETRY: SHX5429

## 2019-04-13 SURGERY — MANOMETRY, ESOPHAGUS

## 2019-04-13 MED ORDER — LIDOCAINE VISCOUS HCL 2 % MT SOLN
OROMUCOSAL | Status: AC
  Filled 2019-04-13: qty 15

## 2019-04-13 SURGICAL SUPPLY — 2 items
FACESHIELD LNG OPTICON STERILE (SAFETY) IMPLANT
GLOVE BIO SURGEON STRL SZ8 (GLOVE) ×4 IMPLANT

## 2019-04-13 NOTE — Progress Notes (Signed)
Esophageal manometry done per protocol.  Patient tolerated well.  Report to be sent to Dr Kavitha Nandigam.   

## 2019-04-15 ENCOUNTER — Encounter (HOSPITAL_COMMUNITY): Payer: Self-pay | Admitting: Gastroenterology

## 2019-04-20 ENCOUNTER — Encounter: Payer: Self-pay | Admitting: *Deleted

## 2019-04-21 ENCOUNTER — Ambulatory Visit (INDEPENDENT_AMBULATORY_CARE_PROVIDER_SITE_OTHER): Payer: 59 | Admitting: Cardiology

## 2019-04-21 ENCOUNTER — Encounter: Payer: Self-pay | Admitting: Cardiology

## 2019-04-21 ENCOUNTER — Other Ambulatory Visit: Payer: Self-pay

## 2019-04-21 VITALS — BP 122/64 | HR 52 | Ht 71.0 in | Wt 171.0 lb

## 2019-04-21 DIAGNOSIS — R001 Bradycardia, unspecified: Secondary | ICD-10-CM | POA: Diagnosis not present

## 2019-04-21 DIAGNOSIS — E785 Hyperlipidemia, unspecified: Secondary | ICD-10-CM

## 2019-04-21 DIAGNOSIS — I10 Essential (primary) hypertension: Secondary | ICD-10-CM

## 2019-04-21 DIAGNOSIS — R42 Dizziness and giddiness: Secondary | ICD-10-CM | POA: Diagnosis not present

## 2019-04-21 NOTE — Progress Notes (Signed)
Cardiology Consultation:    Date:  04/21/2019   ID:  Randy Larson, DOB [birth year], MRN [id number]  PCP:  Serita Grammes, MD  Cardiologist:  Jenne Campus, MD   Referring MD: Serita Grammes, MD   Chief Complaint  Patient presents with   Pre-op Exam  I need surgery for my stomach  History of Present Illness:    Randy Larson is a 63 y.o. male who is being seen today for the evaluation of preop evaluation at the request of Serita Grammes, MD.  I did see him in 2017 he was referred to [place] because of atypical chest pain.  Quite extensive evaluation has been done and all turned out negative.  This time it looks like he required large hernia surgery.  Apparently he was referred to [place] to be assessed from cardiac standpoint of view before the surgery.  Interestingly we noted today him to be bradycardic his EKG today shows sinus bradycardia rate of 49 normal QS complex duration morphology normal PR interval.  He does not have any syncope but he described to have dizziness sometimes the dizziness happened when he gets up quickly or sometimes when he walks.  Denies have any nightmares.  There is no chest pain no tightness no squeezing no pressure no burning in the chest.  He reports that within last 6 months he clearly noticed his ability to exercise to diminish somewhat.  Past Medical History:  Diagnosis Date   Atypical chest pain 11/09/2015   Bilateral chronic knee pain 01/24/2017   Contusion of left elbow 07/24/2018   Dark stools 12/02/2018   Dyslipidemia 11/09/2015   Essential hypertension 11/09/2015   GERD (gastroesophageal reflux disease)    Hemorrhoids    History of colonic polyps 12/02/2018   History of pneumothorax    Incarcerated hiatal hernia containing stomach, colon, and pancreas 12/02/2018      Current Medications: Current Meds  Medication Sig   lisinopril (ZESTRIL) 10 MG tablet Take 10 mg by mouth daily.   pantoprazole (PROTONIX) 40 MG tablet Take 40 mg by  mouth daily.     Allergies:   Patient has no known allergies.   Social History   Socioeconomic History   Marital status: Married    Spouse name: Not on file   Number of children: Not on file   Years of education: Not on file   Highest education level: Not on file  Occupational History   Not on file  Social Needs   Financial resource strain: Not on file   Food insecurity    Worry: Not on file    Inability: Not on file   Transportation needs    Medical: Not on file    Non-medical: Not on file  Tobacco Use   Smoking status: Former Smoker    Types: Cigarettes   Smokeless tobacco: Never Used  Substance and Sexual Activity   Alcohol use: Not on file    Comment: Rare/special occasion   Drug use: Not on file   Sexual activity: Not on file  Lifestyle   Physical activity    Days per week: Not on file    Minutes per session: Not on file   Stress: Not on file  Relationships   Social connections    Talks on phone: Not on file    Gets together: Not on file    Attends religious service: Not on file    Active member of club or organization: Not on file    Attends meetings of  clubs or organizations: Not on file    Relationship status: Not on file  Other Topics Concern   Not on file  Social History Narrative   Not on file     Family History: The patient's family history includes Congestive Heart Failure in his father and mother; Diabetes in his mother; Heart disease in his mother; Hypertension in his mother. ROS:   Please see the history of present illness.    All 14 point review of systems negative except as described per history of present illness.  EKGs/Labs/Other Studies Reviewed:    The following studies were reviewed today:   EKG:  EKG is  ordered today.  The ekg ordered today demonstrates sinus bradycardia rate 49 normal P interval normal QS complex duration morphology  Recent Labs: No results found for requested labs within last 8760 hours.    Recent Lipid Panel No results found for: CHOL, TRIG, HDL, CHOLHDL, VLDL, LDLCALC, LDLDIRECT  Physical Exam:    VS:  BP 122/64    Pulse (!) 52    Ht 5\' 11"  (1.803 m)    Wt 171 lb (77.6 kg)    SpO2 96%    BMI 23.85 kg/m     Wt Readings from Last 3 Encounters:  04/21/19 171 lb (77.6 kg)     GEN:  Well nourished, well developed in no acute distress HEENT: Normal NECK: No JVD; No carotid bruits LYMPHATICS: No lymphadenopathy CARDIAC: RRR, no murmurs, no rubs, no gallops RESPIRATORY:  Clear to auscultation without rales, wheezing or rhonchi  ABDOMEN: Soft, non-tender, non-distended MUSCULOSKELETAL:  No edema; No deformity  SKIN: Warm and dry NEUROLOGIC:  Alert and oriented x 3 PSYCHIATRIC:  Normal affect   ASSESSMENT:    1. Dyslipidemia   2. Dizziness   3. Sinus bradycardia   4. Essential hypertension    PLAN:    In order of problems listed above:  1. Dizziness with sinus bradycardia.  Apparently he did have a monitor placed by his primary care physician I do not have results of it we will get it obviously we will try to figure out if he got enough indication for pacemaker.  I will do looking for his chronotropic response as well. 2. Dyslipidemia.  I will call primary care physician to get his fasting lipid profile 3. Essential hypertension blood pressure well controlled continue present management. 4. Preop evaluation I will ask him to have an echocardiogram as well to check the left ventricular ejection fraction as well as degree of left ventricular hypertrophy.   Medication Adjustments/Labs and Tests Ordered: Current medicines are reviewed at length with the patient today.  Concerns regarding medicines are outlined above.  No orders of the defined types were placed in this encounter.  No orders of the defined types were placed in this encounter.   Signed, Park Liter, MD, [facility]. 04/21/2019 2:48 PM    [place]

## 2019-04-21 NOTE — Patient Instructions (Signed)
Medication Instructions:  Your physician recommends that you continue on your current medications as directed. Please refer to the Current Medication list given to you today.  If you need a refill on your cardiac medications before your next appointment, please call your pharmacy.   Lab work: None.  If you have labs (blood work) drawn today and your tests are completely normal, you will receive your results only by: . MyChart Message (if you have MyChart) OR . A paper copy in the mail If you have any lab test that is abnormal or we need to change your treatment, we will call you to review the results.  Testing/Procedures: None.   Follow-Up: At [facility], you and your health needs are our priority.  As part of our continuing mission to provide you with exceptional heart care, we have created designated Provider Care Teams.  These Care Teams include your primary Cardiologist (physician) and Advanced Practice Providers (APPs -  Physician Assistants and Nurse Practitioners) who all work together to provide you with the care you need, when you need it. You will need a follow up appointment in 1 months.  Please call our office 2 months in advance to schedule this appointment.  You may see No primary care provider on file. or another member of our [facility] Provider Team in [place]: Brian Munley, MD . Rajan Revankar, MD  Any Other Special Instructions Will Be Listed Below (If Applicable).     

## 2019-04-22 NOTE — Addendum Note (Signed)
Addended by: Anselm Pancoast on: 04/22/2019 08:46 AM   Modules accepted: Orders

## 2019-04-23 ENCOUNTER — Telehealth: Payer: Self-pay | Admitting: Emergency Medicine

## 2019-04-23 NOTE — Telephone Encounter (Signed)
Left message for patient to return call to go over recommendations from Dr. Agustin Cree.

## 2019-04-24 MED ORDER — DILTIAZEM HCL ER COATED BEADS 120 MG PO CP24: 120.0000 mg | ORAL_CAPSULE | Freq: Every day | ORAL | 1 refills | Status: DC

## 2019-04-24 NOTE — Telephone Encounter (Signed)
Patient called back. Informed him to start cardizem 120 mg daily. Patient advised if the become dizzy on the medication to stop it and call us. Patient verbally understands.

## 2019-04-24 NOTE — Addendum Note (Signed)
Addended by: Linna Hoff R on: 04/24/2019 08:30 AM   Modules accepted: Orders

## 2019-04-27 ENCOUNTER — Telehealth: Payer: Self-pay | Admitting: Cardiology

## 2019-04-27 NOTE — Telephone Encounter (Signed)
Patient began feeling dizzy yesterday after working in the yard at home, and then some today at work. Dr. Agustin Cree asked them to stop medication if he became dizzy. I will inform Dr. Agustin Cree. Patient wife informed to have patient hold medication at this time.

## 2019-04-27 NOTE — Telephone Encounter (Signed)
Patient is still dizzy after taking diltiazem, please advise

## 2019-04-27 NOTE — Telephone Encounter (Signed)
Wife called back and informed me that patient is also having dyspnea on exertion. Dr. Agustin Cree informed and advised for the patient to hold diltiazem for now and call us if anything gets worse.

## 2019-04-29 ENCOUNTER — Telehealth: Payer: Self-pay | Admitting: Cardiology

## 2019-04-29 DIAGNOSIS — Z01818 Encounter for other preprocedural examination: Secondary | ICD-10-CM

## 2019-04-29 NOTE — Telephone Encounter (Signed)
Patient called with questions regarding his medicine.  Please call patient to discuss

## 2019-04-29 NOTE — Telephone Encounter (Signed)
Left message for patient to return call.

## 2019-04-30 NOTE — Addendum Note (Signed)
Addended by: Ashok Norris on: 04/30/2019 10:22 AM   Modules accepted: Orders

## 2019-04-30 NOTE — Telephone Encounter (Signed)
Patient called back and is asking about surgery clearance. I informed him this will need to be sent to [place] and that he needs a echo based on Dr. Wendy Poet last note. Scheduled patient for echo. No further questions.

## 2019-05-01 DIAGNOSIS — K449 Diaphragmatic hernia without obstruction or gangrene: Secondary | ICD-10-CM

## 2019-05-01 DIAGNOSIS — K219 Gastro-esophageal reflux disease without esophagitis: Secondary | ICD-10-CM

## 2019-05-06 ENCOUNTER — Other Ambulatory Visit: Payer: 59

## 2019-05-13 ENCOUNTER — Ambulatory Visit (INDEPENDENT_AMBULATORY_CARE_PROVIDER_SITE_OTHER): Payer: 59

## 2019-05-13 DIAGNOSIS — Z01810 Encounter for preprocedural cardiovascular examination: Secondary | ICD-10-CM

## 2019-05-13 NOTE — Progress Notes (Signed)
Complete echocardiogram has been performed.  Jimmy Deontaye Civello RDCS, RVT 

## 2019-05-18 ENCOUNTER — Telehealth: Payer: Self-pay | Admitting: Cardiology

## 2019-05-18 NOTE — Telephone Encounter (Signed)
Patient informed of results and given fax number to our office to have clearance sent here. Patient verbally understands. No further questions.

## 2019-05-18 NOTE — Telephone Encounter (Signed)
Please call patient with results of Echocardiogram. He needs to know if he is cleared for surgery.

## 2019-05-25 ENCOUNTER — Ambulatory Visit: Payer: Self-pay | Admitting: Surgery

## 2019-05-25 NOTE — H&P (Signed)
[name] DOB: 1956/06/26 Married / Language: [name] / Race: White Male  History of Present Illness Adin Hector MD; 12/02/2018 1:07 PM)   Patient Care Team: Serita Grammes, MD as PCP - General (Family Medicine) Michael [place], MD as Consulting Physician (General Surgery) Jackquline Denmark, MD as Consulting Physician (Gastroenterology) Park Liter, MD as Consulting Physician (Cardiology) Vickey Huger, MD as Consulting Physician (Orthopedic Surgery)   ` ` Patient sent for surgical consultation at the request of Dr Andrez Grime, [place]  Chief Complaint: Giant hiatal hernia ` ` The patient is a pleasant gentleman comes in today with his wife. He had episode of pneumonia and was found to have a large hiatal hernia by chest x-ray. His gradually gotten larger. Patient has had heartburn and reflux issues. He is required proton pump inhibitors for almost 10 years. Gets symptoms of heartburn and reflux if he is not on that medication. Episode of severe chest pain that concerned him. Was evaluated by cardiology negative for any coronary major issues. History of colon polypsa colonoscopy by Dr. Lyndel Safe about 10 years ago. An endoscopy at that time that he recalls being told he had a hiatal hernia as well.  Patient has had some episodes of bloating and dysphagia. He's had a few times for his had to force himself to vomit. Usually sleeps on a pillow. He cannot lie down flat. He feels discomfort and reflux when he bends over. He also noticed that his bowels have changed. Requires more straining. Has not had a well-formed satisfying bowel movement in 6 months. Often has to strain to have some loose mushy bowel movements. Sometimes his stools arer black. Often hears some weird gurgling up in his chest and even upper abdomen after he eats. It concerns his wife. He can walk 30 minutes without any difficulty. He had some inguinal hernia surgery done by Dr. Pauletta Browns  about 10 years ago. He's had some right groin pain ever since. Nothing too severe. He does struggle with significant back pain as well. Primarily thoracic. I believe he seen orthopedic surgeon for this. No strong recommendations for surgery or injections. Patient was concerned that he has had some unintentional weight loss. He was 177 pounds and 2017. He is now 171. Patient notes he had gained weight into the bed 180s as of a year ago. Based on concerns of intermittent vomiting and unintentional weight loss within the setting of a giant hernia, surgical consultation recommended. Hiatal hernia confirmed. Given its giant size and complexity, patient referred to me for further evaluation.  No personal nor family history of GI/colon cancer, inflammatory bowel disease, irritable bowel syndrome, allergy such as Celiac Sprue, dietary/dairy problems, colitis, ulcers nor gastritis. No recent sick contacts/gastroenteritis. No travel outside the country. No changes in diet. No hematemesis, coffee ground emesis. No evidence of prior gastric/peptic ulceration.  (Review of systems as stated in this history (HPI) or in the review of systems. Otherwise all other 12 point ROS are negative) ` ` `   Diagnostic Studies History (Tanisha A. Owens Shark, [place]; 12/02/2018 8:44 AM) Colonoscopy 5-10 years ago  Allergies (Tanisha A. Owens Shark, [place]; 12/02/2018 8:45 AM) No Known Drug Allergies [12/02/2018]: Allergies Reconciled  Medication History (Tanisha A. Owens Shark, RMA; 12/02/2018 8:45 AM) Lisinopril (10MG  Tablet, Oral) Active. Pantoprazole Sodium (40MG  Tablet DR, Oral) Active. Medications Reconciled  Social History (Tanisha A. Owens Shark, [place]; 12/02/2018 8:44 AM) Alcohol use Moderate alcohol use. Caffeine use Carbonated beverages, Coffee. Tobacco use Former smoker.  Family History (Tanisha A.  Owens Shark, [place]; 12/02/2018 8:44 AM) Diabetes Mellitus Mother, Sister. Hypertension Mother. Rectal Cancer  Brother.  Other Problems (Tanisha A. Owens Shark, [place]; 12/02/2018 8:44 AM) Anxiety Disorder Back Pain Gastroesophageal Reflux Disease Heart murmur Hemorrhoids High blood pressure Umbilical Hernia Repair     Review of Systems (Tanisha A. Brown RMA; 12/02/2018 8:44 AM) General Present- Fatigue and Weight Loss. Not Present- Appetite Loss, Chills, Fever, Night Sweats and Weight Gain. Skin Present- Dryness. Not Present- Change in Wart/Mole, Hives, Jaundice, New Lesions, Non-Healing Wounds, Rash and Ulcer. HEENT Present- Ringing in the Ears. Not Present- Earache, Hearing Loss, Hoarseness, Nose Bleed, Oral Ulcers, Seasonal Allergies, Sinus Pain, Sore Throat, Visual Disturbances, Wears glasses/contact lenses and Yellow Eyes. Cardiovascular Present- Leg Cramps and Shortness of Breath. Not Present- Chest Pain, Difficulty Breathing Lying Down, Palpitations, Rapid Heart Rate and Swelling of Extremities. Gastrointestinal Present- Constipation, Difficulty Swallowing and Excessive gas. Not Present- Abdominal Pain, Bloating, Bloody Stool, Change in Bowel Habits, Chronic diarrhea, Gets full quickly at meals, Hemorrhoids, Indigestion, Nausea, Rectal Pain and Vomiting. Male Genitourinary Present- Impotence. Not Present- Blood in Urine, Change in Urinary Stream, Frequency, Nocturia, Painful Urination, Urgency and Urine Leakage. Musculoskeletal Present- Back Pain and Muscle Weakness. Not Present- Joint Pain, Joint Stiffness, Muscle Pain and Swelling of Extremities. Neurological Present- Trouble walking and Weakness. Not Present- Decreased Memory, Fainting, Headaches, Numbness, Seizures, Tingling and Tremor. Psychiatric Present- Anxiety and Change in Sleep Pattern. Not Present- Bipolar, Depression, Fearful and Frequent crying. Hematology Present- Easy Bruising. Not Present- Blood Thinners, Excessive bleeding, Gland problems, HIV and Persistent Infections.  Vitals (Tanisha A. Brown RMA; 12/02/2018 8:45  AM) 12/02/2018 8:45 AM Weight: 171.4 lb Height: 71in Body Surface Area: 1.98 m Body Mass Index: 23.91 kg/m  Temp.: 98.36F  BP: 122/84 (Sitting, Left Arm, Standard)      Physical Exam Adin Hector MD; 12/02/2018 1:06 PM)  General Mental Status-Alert. General Appearance-Not in acute distress, Not Sickly. Orientation-Oriented X3. Hydration-Well hydrated. Voice-Normal.  Integumentary Global Assessment Upon inspection and palpation of skin surfaces of the - Axillae: non-tender, no inflammation or ulceration, no drainage. and Distribution of scalp and body hair is normal. General Characteristics Temperature - normal warmth is noted.  Head and Neck Head-normocephalic, atraumatic with no lesions or palpable masses. Face Global Assessment - atraumatic, no absence of expression. Neck Global Assessment - no abnormal movements, no bruit auscultated on the right, no bruit auscultated on the left, no decreased range of motion, non-tender. Trachea-midline. Thyroid Gland Characteristics - non-tender.  Eye Eyeball - Left-Extraocular movements intact, No Nystagmus. Eyeball - Right-Extraocular movements intact, No Nystagmus. Cornea - Left-No Hazy. Cornea - Right-No Hazy. Sclera/Conjunctiva - Left-No scleral icterus, No Discharge. Sclera/Conjunctiva - Right-No scleral icterus, No Discharge. Pupil - Left-Direct reaction to light normal. Pupil - Right-Direct reaction to light normal.  ENMT Ears Pinna - Left - no drainage observed, no generalized tenderness observed. Right - no drainage observed, no generalized tenderness observed. Nose and Sinuses External Inspection of the Nose - no destructive lesion observed. Inspection of the nares - Left - quiet respiration. Right - quiet respiration. Mouth and Throat Lips - Upper Lip - no fissures observed, no pallor noted. Lower Lip - no fissures observed, no pallor noted. Nasopharynx - no  discharge present. Oral Cavity/Oropharynx - Tongue - no dryness observed. Oral Mucosa - no cyanosis observed. Hypopharynx - no evidence of airway distress observed.  Chest and Lung Exam Inspection Movements - Normal and Symmetrical. Accessory muscles - No use of accessory muscles in breathing. Palpation Palpation of  the chest reveals - Non-tender. Auscultation Breath sounds - Normal and Clear.  Cardiovascular Auscultation Rhythm - Regular. Murmurs & Other Heart Sounds - Auscultation of the heart reveals - No Murmurs and No Systolic Clicks.  Abdomen Inspection Inspection of the abdomen reveals - No Visible peristalsis and No Abnormal pulsations. Umbilicus - No Bleeding, No Urine drainage. Palpation/Percussion Palpation and Percussion of the abdomen reveal - Soft, Non Tender, No Rebound tenderness, No Rigidity (guarding) and No Cutaneous hyperesthesia. Note: Abdomen soft. Nontender. Not distended. No umbilical or incisional hernias. No guarding.  Male Genitourinary Sexual Maturity Tanner 5 - Adult hair pattern and Adult penile size and shape. Note: Mild right groin discomfort but no definite hernia. More subtle impulse on the left side suspicious for small recurrent left inguinal hernia. Otherwise normal external male genitalia.  Peripheral Vascular Upper Extremity Inspection - Left - No Cyanotic nailbeds, Not Ischemic. Right - No Cyanotic nailbeds, Not Ischemic.  Neurologic Neurologic evaluation reveals -normal attention span and ability to concentrate, able to name objects and repeat phrases. Appropriate fund of knowledge , normal sensation and normal coordination. Mental Status Affect - not angry, not paranoid. Cranial Nerves-Normal Bilaterally. Gait-Normal.  Neuropsychiatric Mental status exam performed with findings of-able to articulate well with normal speech/language, rate, volume and coherence, thought content normal with ability to perform basic  computations and apply abstract reasoning and no evidence of hallucinations, delusions, obsessions or homicidal/suicidal ideation.  Musculoskeletal Global Assessment Spine, Ribs and Pelvis - no instability, subluxation or laxity. Right Upper Extremity - no instability, subluxation or laxity.  Lymphatic Head & Neck  General Head & Neck Lymphatics: Bilateral - Description - No Localized lymphadenopathy. Axillary  General Axillary Region: Bilateral - Description - No Localized lymphadenopathy. Femoral & Inguinal  Generalized Femoral & Inguinal Lymphatics: Left - Description - No Localized lymphadenopathy. Right - Description - No Localized lymphadenopathy.    Assessment & Plan Adin Hector MD; 12/02/2018 1:10 PM)  INCARCERATED HIATAL HERNIA (K44.0) Impression: Giant hiatal hernia with all his stomach, part of his transverse colon, and even part of his pancreas going up into it. The defect itself not particularly large.  Because of symptoms of heartburn, dysphagia, chest pain, dark stools, bowel changes; I think he would benefit from surgery to fix this.  Reasonable minimally invasive approach. I intended to do these robotically. Primary closure over pledgets. Absorbable Phasix mesh reinforcement. Given its large size with multiple organs involved, I suspect this will take longer than average despite his thin body habitus and lack of prior numerous surgeries. We will see. I discussed the technique at length with the patient. Wife more anxious than him about it. Tried to reassure her. I did caution him that people can have issues with chest wall discomfort and shoulder discomfort after surgery. Unguarded again saying that his thoracic back pain is caused by this but in the long run it should improve. Would defer to Dr. Ronnie Derby who with orthopedics that is seen him about this.  Preoperative manometry  She had no significant esophageal dysmotility.  Therefore can tolerate  fundoplication.  Did discuss risks of gastric bloating and inability to vomit. His risk of recurrence is increased given the large size. However he is thin and does not smoke. Hopefully we can minimize the risk. I did note it'll take several months for his diet to gradually advance, but it should gradually improve. He didn't like the idea of not eating steaks right away, but he understood the reasoning. This hiatal hernia it  is quite large already and it will only get worse. They are interested in proceeding. We'll work to coordinate this at a convenient time.   The anatomy & physiology of the foregut and anti-reflux mechanism was discussed. The pathophysiology of hiatal herniation and GERD was discussed. Natural history risks without surgery was discussed. The patient's symptoms are not adequately controlled by medicines and other non-operative treatments. I feel the risks of no intervention will lead to serious problems that outweigh the operative risks; therefore, I recommended surgery to reduce the hiatal hernia out of the chest and fundoplication to rebuild the anti-reflux valve and control reflux better. Need for a thorough workup to rule out the differential diagnosis and plan treatment was explained. I explained laparoscopic techniques with possible need for an open approach.  Risks such as bleeding, infection, abscess, leak, need for further treatment, heart attack, death, and other risks were discussed. I noted a good likelihood this will help address the problem. Goals of post-operative recovery were discussed as well. Possibility that this will not correct all symptoms was explained. Post-operative dysphagia, need for short-term liquid & pureed diet, inability to vomit, possibility of reherniation, possible need for medicines to help control symptoms in addition to surgery were discussed. We will work to minimize complications. Educational handouts further explaining the  pathology, treatment options, and dysphagia diet was given as well. Questions were answered. The patient expresses understanding & wishes to proceed with surgery.   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 Pt Education - [facility] Laparoscopic Surgery HCI  CHRONIC GROIN PAIN, RIGHT (R10.31) Impression: Chronic right groin pain after open inguinal hernia repair by Dr. Reita May over 10 years ago. He could have hernia recurrence, but nothing obvious on the right side. Actually, I thought I felt something to be in the left side. I recommended groin stretches, anti-inflammatories, Ice / Heat. I would table on any inguinal hernia issues until he gets over his hiatal hernia surgery and has his colonoscopy done since they are more pressing issues.    HISTORY OF ADENOMATOUS POLYP OF COLON (Z86.010) Impression: Agree with plan with follow-up colonoscopy given his bowel changes and occasional melena. For likely is due to his hiatal hernia causing Cameron ulcerations but he is overdue for colonoscopy.  Could consider doing a preoperatively but they may hesitate since part of his transverse colon is incarcerated up in his chest. It might be better to do the hiatal hernia surgery first and then plan colonoscopy 6-12 weeks later.  Current Plans Pt Education - Polyps in the Colon and Rectum (Colonic and Rectal Polyps): colonic polyps  Adin Hector, MD, FACS, MASCRS Gastrointestinal and Minimally Invasive Surgery    [address] [place], [place] [address] [phone] Main / Paging [phone] Fax

## 2019-05-25 NOTE — H&P (View-Only) (Signed)
Randy Larson DOB: [birth year] Married / Language: Randy Larson / Race: White Male  History of Present Illness Randy Hector MD; 12/02/2018 1:07 PM)   Patient Care Team: Serita Grammes, MD as PCP - General (Family Medicine) Michael Boston, MD as Consulting Physician (General Surgery) Jackquline Denmark, MD as Consulting Physician (Gastroenterology) Park Liter, MD as Consulting Physician (Cardiology) Vickey Huger, MD as Consulting Physician (Orthopedic Surgery)   ` ` Patient sent for surgical consultation at the request of Dr Andrez Grime, [place]  Chief Complaint: Giant hiatal hernia ` ` The patient is a pleasant gentleman comes in today with his wife. He had episode of pneumonia and was found to have a large hiatal hernia by chest x-ray. His gradually gotten larger. Patient has had heartburn and reflux issues. He is required proton pump inhibitors for almost 10 years. Gets symptoms of heartburn and reflux if he is not on that medication. Episode of severe chest pain that concerned him. Was evaluated by cardiology negative for any coronary major issues. History of colon polypsa colonoscopy by Dr. Lyndel Safe about 10 years ago. An endoscopy at that time that he recalls being told he had a hiatal hernia as well.  Patient has had some episodes of bloating and dysphagia. He's had a few times for his had to force himself to vomit. Usually sleeps on a pillow. He cannot lie down flat. He feels discomfort and reflux when he bends over. He also noticed that his bowels have changed. Requires more straining. Has not had a well-formed satisfying bowel movement in 6 months. Often has to strain to have some loose mushy bowel movements. Sometimes his stools arer black. Often hears some weird gurgling up in his chest and even upper abdomen after he eats. It concerns his wife. He can walk 30 minutes without any difficulty. He had some inguinal hernia surgery done by Dr. Pauletta Browns  about 10 years ago. He's had some right groin pain ever since. Nothing too severe. He does struggle with significant back pain as well. Primarily thoracic. I believe he seen orthopedic surgeon for this. No strong recommendations for surgery or injections. Patient was concerned that he has had some unintentional weight loss. He was 177 pounds and 2017. He is now 171. Patient notes he had gained weight into the bed 180s as of a year ago. Based on concerns of intermittent vomiting and unintentional weight loss within the setting of a giant hernia, surgical consultation recommended. Hiatal hernia confirmed. Given its giant size and complexity, patient referred to me for further evaluation.  No personal nor family history of GI/colon cancer, inflammatory bowel disease, irritable bowel syndrome, allergy such as Celiac Sprue, dietary/dairy problems, colitis, ulcers nor gastritis. No recent sick contacts/gastroenteritis. No travel outside the country. No changes in diet. No hematemesis, coffee ground emesis. No evidence of prior gastric/peptic ulceration.  (Review of systems as stated in this history (HPI) or in the review of systems. Otherwise all other 12 point ROS are negative) ` ` `   Diagnostic Studies History (Tanisha A. Owens Shark, [place]; 12/02/2018 8:44 AM) Colonoscopy 5-10 years ago  Allergies (Tanisha A. Owens Shark, [place]; 12/02/2018 8:45 AM) No Known Drug Allergies [12/02/2018]: Allergies Reconciled  Medication History (Tanisha A. Owens Shark, RMA; 12/02/2018 8:45 AM) Lisinopril (10MG  Tablet, Oral) Active. Pantoprazole Sodium (40MG  Tablet DR, Oral) Active. Medications Reconciled  Social History (Tanisha A. Owens Shark, [place]; 12/02/2018 8:44 AM) Alcohol use Moderate alcohol use. Caffeine use Carbonated beverages, Coffee. Tobacco use Former smoker.  Family History (Tanisha A.  Owens Shark, [place]; 12/02/2018 8:44 AM) Diabetes Mellitus Mother, Sister. Hypertension Mother. Rectal Cancer  Brother.  Other Problems (Tanisha A. Owens Shark, [place]; 12/02/2018 8:44 AM) Anxiety Disorder Back Pain Gastroesophageal Reflux Disease Heart murmur Hemorrhoids High blood pressure Umbilical Hernia Repair     Review of Systems (Tanisha A. Brown RMA; 12/02/2018 8:44 AM) General Present- Fatigue and Weight Loss. Not Present- Appetite Loss, Chills, Fever, Night Sweats and Weight Gain. Skin Present- Dryness. Not Present- Change in Wart/Mole, Hives, Jaundice, New Lesions, Non-Healing Wounds, Rash and Ulcer. HEENT Present- Ringing in the Ears. Not Present- Earache, Hearing Loss, Hoarseness, Nose Bleed, Oral Ulcers, Seasonal Allergies, Sinus Pain, Sore Throat, Visual Disturbances, Wears glasses/contact lenses and Yellow Eyes. Cardiovascular Present- Leg Cramps and Shortness of Breath. Not Present- Chest Pain, Difficulty Breathing Lying Down, Palpitations, Rapid Heart Rate and Swelling of Extremities. Gastrointestinal Present- Constipation, Difficulty Swallowing and Excessive gas. Not Present- Abdominal Pain, Bloating, Bloody Stool, Change in Bowel Habits, Chronic diarrhea, Gets full quickly at meals, Hemorrhoids, Indigestion, Nausea, Rectal Pain and Vomiting. Male Genitourinary Present- Impotence. Not Present- Blood in Urine, Change in Urinary Stream, Frequency, Nocturia, Painful Urination, Urgency and Urine Leakage. Musculoskeletal Present- Back Pain and Muscle Weakness. Not Present- Joint Pain, Joint Stiffness, Muscle Pain and Swelling of Extremities. Neurological Present- Trouble walking and Weakness. Not Present- Decreased Memory, Fainting, Headaches, Numbness, Seizures, Tingling and Tremor. Psychiatric Present- Anxiety and Change in Sleep Pattern. Not Present- Bipolar, Depression, Fearful and Frequent crying. Hematology Present- Easy Bruising. Not Present- Blood Thinners, Excessive bleeding, Gland problems, HIV and Persistent Infections.  Vitals (Tanisha A. Brown RMA; 12/02/2018 8:45  AM) 12/02/2018 8:45 AM Weight: 171.4 lb Height: 71in Body Surface Area: 1.98 m Body Mass Index: 23.91 kg/m  Temp.: 98.65F  BP: 122/84 (Sitting, Left Arm, Standard)      Physical Exam Randy Hector MD; 12/02/2018 1:06 PM)  General Mental Status-Alert. General Appearance-Not in acute distress, Not Sickly. Orientation-Oriented X3. Hydration-Well hydrated. Voice-Normal.  Integumentary Global Assessment Upon inspection and palpation of skin surfaces of the - Axillae: non-tender, no inflammation or ulceration, no drainage. and Distribution of scalp and body hair is normal. General Characteristics Temperature - normal warmth is noted.  Head and Neck Head-normocephalic, atraumatic with no lesions or palpable masses. Face Global Assessment - atraumatic, no absence of expression. Neck Global Assessment - no abnormal movements, no bruit auscultated on the right, no bruit auscultated on the left, no decreased range of motion, non-tender. Trachea-midline. Thyroid Gland Characteristics - non-tender.  Eye Eyeball - Left-Extraocular movements intact, No Nystagmus. Eyeball - Right-Extraocular movements intact, No Nystagmus. Cornea - Left-No Hazy. Cornea - Right-No Hazy. Sclera/Conjunctiva - Left-No scleral icterus, No Discharge. Sclera/Conjunctiva - Right-No scleral icterus, No Discharge. Pupil - Left-Direct reaction to light normal. Pupil - Right-Direct reaction to light normal.  ENMT Ears Pinna - Left - no drainage observed, no generalized tenderness observed. Right - no drainage observed, no generalized tenderness observed. Nose and Sinuses External Inspection of the Nose - no destructive lesion observed. Inspection of the nares - Left - quiet respiration. Right - quiet respiration. Mouth and Throat Lips - Upper Lip - no fissures observed, no pallor noted. Lower Lip - no fissures observed, no pallor noted. Nasopharynx - no  discharge present. Oral Cavity/Oropharynx - Tongue - no dryness observed. Oral Mucosa - no cyanosis observed. Hypopharynx - no evidence of airway distress observed.  Chest and Lung Exam Inspection Movements - Normal and Symmetrical. Accessory muscles - No use of accessory muscles in breathing. Palpation Palpation of  the chest reveals - Non-tender. Auscultation Breath sounds - Normal and Clear.  Cardiovascular Auscultation Rhythm - Regular. Murmurs & Other Heart Sounds - Auscultation of the heart reveals - No Murmurs and No Systolic Clicks.  Abdomen Inspection Inspection of the abdomen reveals - No Visible peristalsis and No Abnormal pulsations. Umbilicus - No Bleeding, No Urine drainage. Palpation/Percussion Palpation and Percussion of the abdomen reveal - Soft, Non Tender, No Rebound tenderness, No Rigidity (guarding) and No Cutaneous hyperesthesia. Note: Abdomen soft. Nontender. Not distended. No umbilical or incisional hernias. No guarding.  Male Genitourinary Sexual Maturity Tanner 5 - Adult hair pattern and Adult penile size and shape. Note: Mild right groin discomfort but no definite hernia. More subtle impulse on the left side suspicious for small recurrent left inguinal hernia. Otherwise normal external male genitalia.  Peripheral Vascular Upper Extremity Inspection - Left - No Cyanotic nailbeds, Not Ischemic. Right - No Cyanotic nailbeds, Not Ischemic.  Neurologic Neurologic evaluation reveals -normal attention span and ability to concentrate, able to name objects and repeat phrases. Appropriate fund of knowledge , normal sensation and normal coordination. Mental Status Affect - not angry, not paranoid. Cranial Nerves-Normal Bilaterally. Gait-Normal.  Neuropsychiatric Mental status exam performed with findings of-able to articulate well with normal speech/language, rate, volume and coherence, thought content normal with ability to perform basic  computations and apply abstract reasoning and no evidence of hallucinations, delusions, obsessions or homicidal/suicidal ideation.  Musculoskeletal Global Assessment Spine, Ribs and Pelvis - no instability, subluxation or laxity. Right Upper Extremity - no instability, subluxation or laxity.  Lymphatic Head & Neck  General Head & Neck Lymphatics: Bilateral - Description - No Localized lymphadenopathy. Axillary  General Axillary Region: Bilateral - Description - No Localized lymphadenopathy. Femoral & Inguinal  Generalized Femoral & Inguinal Lymphatics: Left - Description - No Localized lymphadenopathy. Right - Description - No Localized lymphadenopathy.    Assessment & Plan Randy Hector MD; 12/02/2018 1:10 PM)  INCARCERATED HIATAL HERNIA (K44.0) Impression: Giant hiatal hernia with all his stomach, part of his transverse colon, and even part of his pancreas going up into it. The defect itself not particularly large.  Because of symptoms of heartburn, dysphagia, chest pain, dark stools, bowel changes; I think he would benefit from surgery to fix this.  Reasonable minimally invasive approach. I intended to do these robotically. Primary closure over pledgets. Absorbable Phasix mesh reinforcement. Given its large size with multiple organs involved, I suspect this will take longer than average despite his thin body habitus and lack of prior numerous surgeries. We will see. I discussed the technique at length with the patient. Wife more anxious than him about it. Tried to reassure her. I did caution him that people can have issues with chest wall discomfort and shoulder discomfort after surgery. Unguarded again saying that his thoracic back pain is caused by this but in the long run it should improve. Would defer to Dr. Ronnie Derby who with orthopedics that is seen him about this.  Preoperative manometry  She had no significant esophageal dysmotility.  Therefore can tolerate  fundoplication.  Did discuss risks of gastric bloating and inability to vomit. His risk of recurrence is increased given the large size. However he is thin and does not smoke. Hopefully we can minimize the risk. I did note it'll take several months for his diet to gradually advance, but it should gradually improve. He didn't like the idea of not eating steaks right away, but he understood the reasoning. This hiatal hernia it  is quite large already and it will only get worse. They are interested in proceeding. We'll work to coordinate this at a convenient time.   The anatomy & physiology of the foregut and anti-reflux mechanism was discussed. The pathophysiology of hiatal herniation and GERD was discussed. Natural history risks without surgery was discussed. The patient's symptoms are not adequately controlled by medicines and other non-operative treatments. I feel the risks of no intervention will lead to serious problems that outweigh the operative risks; therefore, I recommended surgery to reduce the hiatal hernia out of the chest and fundoplication to rebuild the anti-reflux valve and control reflux better. Need for a thorough workup to rule out the differential diagnosis and plan treatment was explained. I explained laparoscopic techniques with possible need for an open approach.  Risks such as bleeding, infection, abscess, leak, need for further treatment, heart attack, death, and other risks were discussed. I noted a good likelihood this will help address the problem. Goals of post-operative recovery were discussed as well. Possibility that this will not correct all symptoms was explained. Post-operative dysphagia, need for short-term liquid & pureed diet, inability to vomit, possibility of reherniation, possible need for medicines to help control symptoms in addition to surgery were discussed. We will work to minimize complications. Educational handouts further explaining the  pathology, treatment options, and dysphagia diet was given as well. Questions were answered. The patient expresses understanding & wishes to proceed with surgery.   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 Pt Education - [facility] Laparoscopic [facility] HCI  CHRONIC GROIN PAIN, RIGHT (R10.31) Impression: Chronic right groin pain after open inguinal hernia repair by Dr. Reita May over 10 years ago. He could have hernia recurrence, but nothing obvious on the right side. Actually, I thought I felt something to be in the left side. I recommended groin stretches, anti-inflammatories, Ice / Heat. I would table on any inguinal hernia issues until he gets over his hiatal hernia surgery and has his colonoscopy done since they are more pressing issues.    HISTORY OF ADENOMATOUS POLYP OF COLON (Z86.010) Impression: Agree with plan with follow-up colonoscopy given his bowel changes and occasional melena. For likely is due to his hiatal hernia causing Cameron ulcerations but he is overdue for colonoscopy.  Could consider doing a preoperatively but they may hesitate since part of his transverse colon is incarcerated up in his chest. It might be better to do the hiatal hernia surgery first and then plan colonoscopy 6-12 weeks later.  Current Plans Pt Education - Polyps in the Colon and Rectum (Colonic and Rectal Polyps): colonic polyps  Randy Hector, MD, FACS, MASCRS Gastrointestinal and Minimally Invasive Surgery    [address] [place], [place] [address] [phone] Main / Paging [phone] Fax

## 2019-05-27 ENCOUNTER — Other Ambulatory Visit (INDEPENDENT_AMBULATORY_CARE_PROVIDER_SITE_OTHER): Payer: 59

## 2019-05-27 ENCOUNTER — Ambulatory Visit (INDEPENDENT_AMBULATORY_CARE_PROVIDER_SITE_OTHER): Payer: 59 | Admitting: Cardiology

## 2019-05-27 ENCOUNTER — Other Ambulatory Visit: Payer: Self-pay

## 2019-05-27 VITALS — BP 140/74 | HR 59 | Ht 71.0 in | Wt 177.4 lb

## 2019-05-27 DIAGNOSIS — R001 Bradycardia, unspecified: Secondary | ICD-10-CM

## 2019-05-27 DIAGNOSIS — I10 Essential (primary) hypertension: Secondary | ICD-10-CM | POA: Diagnosis not present

## 2019-05-27 DIAGNOSIS — R0789 Other chest pain: Secondary | ICD-10-CM

## 2019-05-27 DIAGNOSIS — E785 Hyperlipidemia, unspecified: Secondary | ICD-10-CM

## 2019-05-27 NOTE — Progress Notes (Signed)
Cardiology Office Note:    Date:  05/27/2019   ID:  Randy Larson, DOB 1956/09/04, MRN [id number]  PCP:  Serita Grammes, MD  Cardiologist:  Jenne Campus, MD    Referring MD: Serita Grammes, MD   Chief Complaint  Patient presents with  . Follow-up  Having dizziness  History of Present Illness:    Randy Larson is a 63 y.o. male he was referred to [place] because of sinus bradycardia.  He did wear monitor placed by primary care physician however he did not have dizziness that now he complained of having dizziness.  Obviously concern is about potentially having symptomatic bradycardia.  He is also scheduled to have hiatal hernia surgery and he is waiting for clearance for it.  His ability to exercise very good he actually feels good when he exercise he get dizzy when he sits quietly.  No chest pain tightness squeezing pressure burning chest good ability to exercise echocardiogram reviewed which showed preserved left ventricular ejection fraction.  Past Medical History:  Diagnosis Date  . Atypical chest pain 11/09/2015  . Bilateral chronic knee pain 01/24/2017  . Contusion of left elbow 07/24/2018  . Dark stools 12/02/2018  . Dyslipidemia 11/09/2015  . Essential hypertension 11/09/2015  . GERD (gastroesophageal reflux disease)   . Hemorrhoids   . History of colonic polyps 12/02/2018  . History of pneumothorax   . Incarcerated hiatal hernia containing stomach, colon, and pancreas 12/02/2018      Current Medications: Current Meds  Medication Sig  . lisinopril (ZESTRIL) 10 MG tablet Take 10 mg by mouth daily.  . pantoprazole (PROTONIX) 40 MG tablet Take 40 mg by mouth daily.     Allergies:   Patient has no known allergies.   Social History   Socioeconomic History  . Marital status: Married    Spouse name: Not on file  . Number of children: Not on file  . Years of education: Not on file  . Highest education level: Not on file  Occupational History  . Not on file  Social Needs   . Financial resource strain: Not on file  . Food insecurity    Worry: Not on file    Inability: Not on file  . Transportation needs    Medical: Not on file    Non-medical: Not on file  Tobacco Use  . Smoking status: Former Smoker    Types: Cigarettes  . Smokeless tobacco: Never Used  Substance and Sexual Activity  . Alcohol use: Not on file    Comment: Rare/special occasion  . Drug use: Not on file  . Sexual activity: Not on file  Lifestyle  . Physical activity    Days per week: Not on file    Minutes per session: Not on file  . Stress: Not on file  Relationships  . Social Herbalist on phone: Not on file    Gets together: Not on file    Attends religious service: Not on file    Active member of club or organization: Not on file    Attends meetings of clubs or organizations: Not on file    Relationship status: Not on file  Other Topics Concern  . Not on file  Social History Narrative  . Not on file     Family History: The patient's family history includes Congestive Heart Failure in his father and mother; Diabetes in his mother; Heart disease in his mother; Hypertension in his mother. ROS:   Please see the history  of present illness.    All 14 point review of systems negative except as described per history of present illness  EKGs/Labs/Other Studies Reviewed:      Recent Labs: No results found for requested labs within last 8760 hours.  Recent Lipid Panel No results found for: CHOL, TRIG, HDL, CHOLHDL, VLDL, LDLCALC, LDLDIRECT  Physical Exam:    VS:  BP 140/74   Pulse (!) 59   Ht 5\' 11"  (1.803 m)   Wt 177 lb 6.4 oz (80.5 kg)   SpO2 97%   BMI 24.74 kg/m     Wt Readings from Last 3 Encounters:  05/27/19 177 lb 6.4 oz (80.5 kg)  04/21/19 171 lb (77.6 kg)     GEN:  Well nourished, well developed in no acute distress HEENT: Normal NECK: No JVD; No carotid bruits LYMPHATICS: No lymphadenopathy CARDIAC: RRR, no murmurs, no rubs, no gallops  RESPIRATORY:  Clear to auscultation without rales, wheezing or rhonchi  ABDOMEN: Soft, non-tender, non-distended MUSCULOSKELETAL:  No edema; No deformity  SKIN: Warm and dry LOWER EXTREMITIES: no swelling NEUROLOGIC:  Alert and oriented x 3 PSYCHIATRIC:  Normal affect   ASSESSMENT:    1. Sinus bradycardia   2. Essential hypertension   3. Dyslipidemia   4. Atypical chest pain    PLAN:    In order of problems listed above:  1. Sinus bradycardia.  Will place another monitor on again the key will be to correlate symptoms with his bradycardia. 2. Essential hypertension blood pressure controlled continue present management. 3. Dyslipidemia awaiting for fasting lipid profile from primary care physician. 4. Atypical chest pain denies having any.  If his monitor did not show any significant bradycardia he will be stable to proceed with surgery as scheduled.   Medication Adjustments/Labs and Tests Ordered: Current medicines are reviewed at length with the patient today.  Concerns regarding medicines are outlined above.  No orders of the defined types were placed in this encounter.  Medication changes: No orders of the defined types were placed in this encounter.   Signed, Park Liter, MD, [facility] 05/27/2019 11:04 AM    [place]

## 2019-05-27 NOTE — Patient Instructions (Signed)
Medication Instructions:  Your physician recommends that you continue on your current medications as directed. Please refer to the Current Medication list given to you today.  If you need a refill on your cardiac medications before your next appointment, please call your pharmacy.   Lab work: None.  If you have labs (blood work) drawn today and your tests are completely normal, you will receive your results only by: Marland Kitchen MyChart Message (if you have MyChart) OR . A paper copy in the mail If you have any lab test that is abnormal or we need to change your treatment, we will call you to review the results.  Testing/Procedures: Your physician has recommended that you wear a holter monitor. Holter monitors are medical devices that record the heart's electrical activity. Doctors most often use these monitors to diagnose arrhythmias. Arrhythmias are problems with the speed or rhythm of the heartbeat. The monitor is a small, portable device. You can wear one while you do your normal daily activities. This is usually used to diagnose what is causing palpitations/syncope (passing out). Wear for 7 days.     Follow-Up: At [facility], you and your health needs are our priority.  As part of our continuing mission to provide you with exceptional heart care, we have created designated Provider Care Teams.  These Care Teams include your primary Cardiologist (physician) and Advanced Practice Providers (APPs -  Physician Assistants and Nurse Practitioners) who all work together to provide you with the care you need, when you need it. You will need a follow up appointment in 3 months.  Please call our office 2 months in advance to schedule this appointment.  You may see No primary care provider on file. or another member of our [employer] Provider Team in [place]: Shirlee More, MD . Jyl Heinz, MD  Any Other Special Instructions Will Be Listed Below (If Applicable).

## 2019-06-04 ENCOUNTER — Ambulatory Visit: Payer: 59 | Admitting: Cardiology

## 2019-06-05 ENCOUNTER — Telehealth: Payer: Self-pay | Admitting: Cardiology

## 2019-06-05 NOTE — Telephone Encounter (Signed)
Called patient. He reports he just sent back his monitor back on Monday. I let him know we don't have it yet but will call him with the results when we get them back.

## 2019-06-05 NOTE — Telephone Encounter (Signed)
Patient is calling concerned about his surgery clearance -- we are waiting on the results of his monitor and he wants to know if there is anything we can do to speed up the process because his surgery is scheduled for 9/16.

## 2019-06-09 NOTE — Patient Instructions (Addendum)
DUE TO COVID-19 ONLY ONE VISITOR IS ALLOWED TO COME WITH YOU AND STAY IN THE WAITING ROOM ONLY DURING PRE OP AND PROCEDURE DAY OF SURGERY. THE 1 VISITOR MAY VISIT WITH YOU AFTER SURGERY IN YOUR PRIVATE ROOM DURING VISITING HOURS ONLY!  YOU NEED TO HAVE A COVID 19 TEST ON__Saturday 09/12/2020_____ @___9  am____, THIS TEST MUST BE DONE BEFORE SURGERY, COME  [address] [place] , [address].  ([place]) ONCE YOUR COVID TEST IS COMPLETED, PLEASE BEGIN THE QUARANTINE INSTRUCTIONS AS OUTLINED IN YOUR HANDOUT.                Randy Larson  06/09/2019   Your procedure is scheduled on: Wednesday 06/17/2019   Report to [facility] Main  Entrance              Report to Admitting at [place]  AM     Call this number if you have problems the morning of surgery [phone]    Remember: Do not eat food  :After Midnight.               NO SOLID FOOD AFTER MIDNIGHT THE NIGHT PRIOR TO SURGERY. NOTHING BY MOUTH  FROM MIDNIGHT EXCEPT CLEAR LIQUIDS FROM MIDNIGHT   UP UNTIL 0545 AM.               PLEASE FINISH ENSURE DRINK PER SURGEON ORDER  WHICH NEEDS TO BE COMPLETED AT 0545 AM .   CLEAR LIQUID DIET   Foods Allowed                                                                     Foods Excluded  Coffee and tea, regular and decaf                             liquids that you cannot  Plain Jell-O any favor except red or purple                                           see through such as: Fruit ices (not with fruit pulp)                                     milk, soups, orange juice  Iced Popsicles                                    All solid food Carbonated beverages, regular and diet                                    Cranberry, grape and apple juices Sports drinks like Gatorade Lightly seasoned clear broth or consume(fat free) Sugar, honey syrup  Sample Menu Breakfast  Lunch                                     Supper Cranberry juice                     Beef broth                            Chicken broth Jell-O                                     Grape juice                           Apple juice Coffee or tea                        Jell-O                                      Popsicle                                                Coffee or tea                        Coffee or tea  _____________________________________________________________________               BRUSH YOUR TEETH MORNING OF SURGERY AND RINSE YOUR MOUTH OUT, NO CHEWING GUM CANDY OR MINTS.     Take these medicines the morning of surgery with A SIP OF WATER: Pantoprazole (Protonix)                                You may not have any metal on your body including hair pins and              piercings  Do not wear jewelry, make-up, lotions, powders or perfumes, deodorant                          Men may shave face and neck.   Do not bring valuables to the hospital. [facility].  Contacts, dentures or bridgework may not be worn into surgery.  Leave suitcase in the car. After surgery it may be brought to your room.                  Please read over the following fact sheets you were given: _____________________________________________________________________             [facility] - Preparing for Surgery Before surgery, you can play an important role.  Because skin is not sterile, your skin needs to be as free of germs as possible.  You can reduce the number of germs on your skin by washing with CHG (chlorahexidine gluconate) soap before surgery.  CHG is  an antiseptic cleaner which kills germs and bonds with the skin to continue killing germs even after washing. Please DO NOT use if you have an allergy to CHG or antibacterial soaps.  If your skin becomes reddened/irritated stop using the CHG and inform your nurse when you arrive at Short Stay. Do not shave (including legs and underarms) for at least 48 hours  prior to the first CHG shower.  You may shave your face/neck. Please follow these instructions carefully:  1.  Shower with CHG Soap the night before surgery and the  morning of Surgery.  2.  If you choose to wash your hair, wash your hair first as usual with your  normal  shampoo.  3.  After you shampoo, rinse your hair and body thoroughly to remove the  shampoo.                           4.  Use CHG as you would any other liquid soap.  You can apply chg directly  to the skin and wash                       Gently with a scrungie or clean washcloth.  5.  Apply the CHG Soap to your body ONLY FROM THE NECK DOWN.   Do not use on face/ open                           Wound or open sores. Avoid contact with eyes, ears mouth and genitals (private parts).                       Wash face,  Genitals (private parts) with your normal soap.             6.  Wash thoroughly, paying special attention to the area where your surgery  will be performed.  7.  Thoroughly rinse your body with warm water from the neck down.  8.  DO NOT shower/wash with your normal soap after using and rinsing off  the CHG Soap.                9.  Pat yourself dry with a clean towel.            10.  Wear clean pajamas.            11.  Place clean sheets on your bed the night of your first shower and do not  sleep with pets. Day of Surgery : Do not apply any lotions/deodorants the morning of surgery.  Please wear clean clothes to the hospital/surgery center.  FAILURE TO FOLLOW THESE INSTRUCTIONS MAY RESULT IN THE CANCELLATION OF YOUR SURGERY PATIENT SIGNATURE_________________________________  NURSE SIGNATURE__________________________________  ________________________________________________________________________

## 2019-06-10 ENCOUNTER — Other Ambulatory Visit: Payer: Self-pay

## 2019-06-10 ENCOUNTER — Encounter (HOSPITAL_COMMUNITY): Payer: Self-pay

## 2019-06-10 ENCOUNTER — Encounter (INDEPENDENT_AMBULATORY_CARE_PROVIDER_SITE_OTHER): Payer: Self-pay

## 2019-06-10 ENCOUNTER — Encounter (HOSPITAL_COMMUNITY)
Admission: RE | Admit: 2019-06-10 | Discharge: 2019-06-10 | Disposition: A | Discharge status: Home / Self Care | Payer: 59 | Source: Ambulatory Visit | Attending: Surgery | Admitting: Surgery

## 2019-06-10 DIAGNOSIS — Z20828 Contact with and (suspected) exposure to other viral communicable diseases: Secondary | ICD-10-CM | POA: Insufficient documentation

## 2019-06-10 DIAGNOSIS — Z01812 Encounter for preprocedural laboratory examination: Secondary | ICD-10-CM | POA: Insufficient documentation

## 2019-06-10 LAB — BASIC METABOLIC PANEL
Anion gap: 6 (ref 5–15)
BUN: 22 mg/dL (ref 8–23)
CO2: 26 mmol/L (ref 22–32)
Calcium: 9.5 mg/dL (ref 8.9–10.3)
Chloride: 106 mmol/L (ref 98–111)
Creatinine, Ser: 0.94 mg/dL (ref 0.61–1.24)
GFR calc Af Amer: >60 mL/min (ref >60)
GFR calc non Af Amer: >60 mL/min (ref >60)
Glucose, Bld: 103 mg/dL — ABNORMAL HIGH (ref 70–99)
Potassium: 4.7 mmol/L (ref 3.5–5.1)
Sodium: 138 mmol/L (ref 135–145)

## 2019-06-10 LAB — CBC
HCT: 50.2 % (ref 39.0–52.0)
Hemoglobin: 16.1 g/dL (ref 13.0–17.0)
MCH: 30.5 pg (ref 26.0–34.0)
MCHC: 32.1 g/dL (ref 30.0–36.0)
MCV: 95.1 fL (ref 80.0–100.0)
Platelets: 248 10*3/uL (ref 150–400)
RBC: 5.28 MIL/uL (ref 4.22–5.81)
RDW: 12.9 % (ref 11.5–15.5)
WBC: 6.2 10*3/uL (ref 4.0–10.5)
nRBC: 0 % (ref 0.0–0.2)

## 2019-06-10 MED ORDER — ENSURE PRE-SURGERY PO LIQD
592.0000 mL | Freq: Once | ORAL | Status: DC
  Filled 2019-06-10: qty 592

## 2019-06-10 MED ORDER — ENSURE PRE-SURGERY PO LIQD
296.0000 mL | Freq: Once | ORAL | Status: DC
  Filled 2019-06-10: qty 296

## 2019-06-10 NOTE — Progress Notes (Signed)
   06/10/19 0816  OBSTRUCTIVE SLEEP APNEA  Have you ever been diagnosed with sleep apnea through a sleep study? No  Do you snore loudly (loud enough to be heard through closed doors)?  1  Do you often feel tired, fatigued, or sleepy during the daytime (such as falling asleep during driving or talking to someone)? 1  Has anyone observed you stop breathing during your sleep? 0  Do you have, or are you being treated for high blood pressure? 1  BMI more than 35 kg/m2? 0  Age > 50 (1-yes) 1  Neck circumference greater than:Male 16 inches or larger, Male 17inches or larger? 0  Male Gender (Yes=1) 1  Obstructive Sleep Apnea Score 5

## 2019-06-10 NOTE — Progress Notes (Signed)
PCP - Dr. Serita Grammes  [facility] Cardiologist - Dr. Jenne Campus  LOV-05/27/2019  Patient   wore holter monitor patch until 06/01/2019 -awaiting results from this reading for Cardiac Clearance- Cardiac Clearance pending!  Chest x-ray - 10/2018 EKG - 05/27/2019 Stress Test - N/A ECHO - 05/13/2019 Cardiac Cath - N/A  Sleep Study -N/A  CPAP - N/A  Fasting Blood Sugar - N/A Checks Blood Sugar _____ times a day  Blood Thinner Instructions: Aspirin Instructions: N/A Last Dose:  Anesthesia review:  Chart given to Ward Givens for review  Patient denies shortness of breath, fever, cough and chest pain at PAT appointment   Patient verbalized understanding of instructions that were given to them at the PAT appointment. Patient was also instructed that they will need to review over the PAT instructions again at home before surgery.

## 2019-06-11 ENCOUNTER — Telehealth: Payer: Self-pay | Admitting: Emergency Medicine

## 2019-06-11 ENCOUNTER — Encounter: Payer: Self-pay | Admitting: Emergency Medicine

## 2019-06-11 NOTE — Telephone Encounter (Signed)
Patient called in regarding surgical clearance. We have not received the patient's monitor results yet due to technical difficulties in getting the report attached to the order. We have printed it and will have Dr. Agustin Cree review it.

## 2019-06-13 ENCOUNTER — Other Ambulatory Visit (HOSPITAL_COMMUNITY)
Admission: RE | Admit: 2019-06-13 | Discharge: 2019-06-13 | Disposition: A | Discharge status: Home / Self Care | Payer: 59 | Source: Ambulatory Visit | Attending: Surgery | Admitting: Surgery

## 2019-06-13 DIAGNOSIS — Z01812 Encounter for preprocedural laboratory examination: Secondary | ICD-10-CM | POA: Diagnosis not present

## 2019-06-14 LAB — NOVEL CORONAVIRUS, NAA (HOSP ORDER, SEND-OUT TO REF LAB; TAT 18-24 HRS): SARS-CoV-2, NAA: NOT DETECTED

## 2019-06-15 NOTE — Anesthesia Preprocedure Evaluation (Addendum)
Anesthesia Evaluation  Patient identified by MRN, date of birth, ID band Patient awake    Reviewed: Allergy & Precautions, NPO status , Patient's Chart, lab work & pertinent test results  Airway Mallampati: II  TM Distance: >3 FB Neck ROM: Full    Dental no notable dental hx.    Pulmonary former smoker,    Pulmonary exam normal breath sounds clear to auscultation       Cardiovascular hypertension, Pt. on medications +CHF  Normal cardiovascular exam Rhythm:Regular Rate:Normal  sinus bradycardia  Echo 05/13/2019  1. The left ventricle has normal systolic function with an ejection fraction of 60-65%. The cavity size was normal. Left ventricular diastolic Doppler parameters are consistent with restrictive filling.  2. The right ventricle has normal systolic function. The cavity was normal. There is no increase in right ventricular wall thickness.  3. The tricuspid valve is grossly normal.  4. The aorta is normal unless otherwise noted.   Neuro/Psych negative neurological ROS  negative psych ROS   GI/Hepatic hiatal hernia, GERD  Medicated,Incarcerated hiatal hernia with stomach, colon, pancreas   Endo/Other  negative endocrine ROS  Renal/GU negative Renal ROS  negative genitourinary   Musculoskeletal  (+) Arthritis , Osteoarthritis,  Chronic B/L knee pain   Abdominal Normal abdominal exam  (+)   Peds  Hematology negative hematology ROS (+)   Anesthesia Other Findings   Reproductive/Obstetrics negative OB ROS                           Anesthesia Physical Anesthesia Plan  ASA: III  Anesthesia Plan: General   Post-op Pain Management:    Induction: Intravenous and Rapid sequence  PONV Risk Score and Plan: 2 and Ondansetron, Dexamethasone and Treatment may vary due to age or medical condition  Airway Management Planned: Oral ETT  Additional Equipment: None  Intra-op Plan:    Post-operative Plan: Extubation in OR  Informed Consent: I have reviewed the patients History and Physical, chart, labs and discussed the procedure including the risks, benefits and alternatives for the proposed anesthesia with the patient or authorized representative who has indicated his/her understanding and acceptance.     Dental advisory given  Plan Discussed with: CRNA  Anesthesia Plan Comments: (  )       Anesthesia Quick Evaluation

## 2019-06-17 ENCOUNTER — Ambulatory Visit (HOSPITAL_COMMUNITY): Payer: 59 | Admitting: Physician Assistant

## 2019-06-17 ENCOUNTER — Inpatient Hospital Stay (HOSPITAL_COMMUNITY)
Admission: AD | Admit: 2019-06-17 | Discharge: 2019-06-19 | DRG: 328 | Disposition: A | Discharge status: Home / Self Care | Payer: 59 | Attending: Surgery | Admitting: Surgery

## 2019-06-17 ENCOUNTER — Encounter (HOSPITAL_COMMUNITY): Payer: Self-pay | Admitting: Emergency Medicine

## 2019-06-17 ENCOUNTER — Other Ambulatory Visit: Payer: Self-pay | Admitting: Cardiology

## 2019-06-17 ENCOUNTER — Encounter (HOSPITAL_COMMUNITY)
Admission: AD | Disposition: A | Discharge status: Home / Self Care | Payer: Self-pay | Source: Home / Self Care | Attending: Surgery

## 2019-06-17 ENCOUNTER — Ambulatory Visit (HOSPITAL_COMMUNITY): Payer: 59 | Admitting: Anesthesiology

## 2019-06-17 ENCOUNTER — Other Ambulatory Visit: Payer: Self-pay

## 2019-06-17 DIAGNOSIS — Z8249 Family history of ischemic heart disease and other diseases of the circulatory system: Secondary | ICD-10-CM | POA: Diagnosis not present

## 2019-06-17 DIAGNOSIS — Z79899 Other long term (current) drug therapy: Secondary | ICD-10-CM

## 2019-06-17 DIAGNOSIS — R1310 Dysphagia, unspecified: Secondary | ICD-10-CM | POA: Diagnosis present

## 2019-06-17 DIAGNOSIS — E785 Hyperlipidemia, unspecified: Secondary | ICD-10-CM | POA: Diagnosis present

## 2019-06-17 DIAGNOSIS — K3189 Other diseases of stomach and duodenum: Secondary | ICD-10-CM | POA: Diagnosis present

## 2019-06-17 DIAGNOSIS — Z86010 Personal history of colonic polyps: Secondary | ICD-10-CM

## 2019-06-17 DIAGNOSIS — K4090 Unilateral inguinal hernia, without obstruction or gangrene, not specified as recurrent: Secondary | ICD-10-CM | POA: Diagnosis present

## 2019-06-17 DIAGNOSIS — K219 Gastro-esophageal reflux disease without esophagitis: Secondary | ICD-10-CM | POA: Diagnosis present

## 2019-06-17 DIAGNOSIS — R011 Cardiac murmur, unspecified: Secondary | ICD-10-CM | POA: Diagnosis present

## 2019-06-17 DIAGNOSIS — F419 Anxiety disorder, unspecified: Secondary | ICD-10-CM | POA: Diagnosis present

## 2019-06-17 DIAGNOSIS — K440 Diaphragmatic hernia with obstruction, without gangrene: Secondary | ICD-10-CM | POA: Diagnosis present

## 2019-06-17 DIAGNOSIS — Z87891 Personal history of nicotine dependence: Secondary | ICD-10-CM | POA: Diagnosis not present

## 2019-06-17 DIAGNOSIS — I10 Essential (primary) hypertension: Secondary | ICD-10-CM | POA: Diagnosis present

## 2019-06-17 DIAGNOSIS — R001 Bradycardia, unspecified: Secondary | ICD-10-CM

## 2019-06-17 DIAGNOSIS — Z9889 Other specified postprocedural states: Secondary | ICD-10-CM

## 2019-06-17 LAB — ABO/RH: ABO/RH(D): O POS

## 2019-06-17 LAB — TYPE AND SCREEN
ABO/RH(D): O POS
Antibody Screen: NEGATIVE

## 2019-06-17 SURGERY — FUNDOPLICATION, NISSEN, ROBOT-ASSISTED, LAPAROSCOPIC
Anesthesia: General | Site: Abdomen

## 2019-06-17 MED ORDER — GABAPENTIN 250 MG/5ML PO SOLN
300.0000 mg | Freq: Two times a day (BID) | ORAL | Status: DC
  Administered 2019-06-17: 300 mg via ORAL
  Filled 2019-06-17 (×3): qty 6

## 2019-06-17 MED ORDER — METOCLOPRAMIDE HCL 5 MG/ML IJ SOLN: 5.0000 mg | Freq: Three times a day (TID) | INTRAMUSCULAR | Status: DC | PRN

## 2019-06-17 MED ORDER — ALUM & MAG HYDROXIDE-SIMETH 200-200-20 MG/5ML PO SUSP: 30.0000 mL | Freq: Four times a day (QID) | ORAL | Status: DC | PRN

## 2019-06-17 MED ORDER — SCOPOLAMINE 1 MG/3DAYS TD PT72
MEDICATED_PATCH | TRANSDERMAL | Status: AC
  Administered 2019-06-17: 1.5 mg via TRANSDERMAL
  Filled 2019-06-17: qty 1

## 2019-06-17 MED ORDER — OXYCODONE HCL 5 MG PO TABS: 5.0000 mg | ORAL_TABLET | Freq: Once | ORAL | Status: DC | PRN

## 2019-06-17 MED ORDER — LACTATED RINGERS IV SOLN
INTRAVENOUS | Status: DC
  Administered 2019-06-17 (×3): via INTRAVENOUS

## 2019-06-17 MED ORDER — LACTATED RINGERS IR SOLN
Status: DC | PRN
  Administered 2019-06-17: 1000 mL

## 2019-06-17 MED ORDER — HYDROCORTISONE (PERIANAL) 2.5 % EX CREA: 1.0000 "application " | TOPICAL_CREAM | Freq: Four times a day (QID) | CUTANEOUS | Status: DC | PRN

## 2019-06-17 MED ORDER — OXYCODONE HCL 5 MG PO TABS: 5.0000 mg | ORAL_TABLET | ORAL | Status: DC | PRN

## 2019-06-17 MED ORDER — PROCHLORPERAZINE EDISYLATE 10 MG/2ML IJ SOLN: 5.0000 mg | Freq: Four times a day (QID) | INTRAMUSCULAR | Status: DC | PRN

## 2019-06-17 MED ORDER — OXYCODONE HCL 5 MG/5ML PO SOLN
5.0000 mg | ORAL | Status: DC | PRN
  Administered 2019-06-18: 10 mg via ORAL
  Filled 2019-06-17: qty 10

## 2019-06-17 MED ORDER — PROMETHAZINE HCL 25 MG RE SUPP: 25.0000 mg | Freq: Four times a day (QID) | RECTAL | 20 refills | Status: DC | PRN

## 2019-06-17 MED ORDER — PROPOFOL 10 MG/ML IV BOLUS
INTRAVENOUS | Status: DC | PRN
  Administered 2019-06-17: 160 mg via INTRAVENOUS

## 2019-06-17 MED ORDER — ENOXAPARIN SODIUM 40 MG/0.4ML ~~LOC~~ SOLN
40.0000 mg | SUBCUTANEOUS | Status: DC
  Administered 2019-06-18 – 2019-06-19 (×2): 40 mg via SUBCUTANEOUS
  Filled 2019-06-17 (×2): qty 0.4

## 2019-06-17 MED ORDER — KETAMINE HCL 10 MG/ML IJ SOLN
INTRAMUSCULAR | Status: AC
  Filled 2019-06-17: qty 1

## 2019-06-17 MED ORDER — DEXAMETHASONE SODIUM PHOSPHATE 4 MG/ML IJ SOLN: 4.0000 mg | INTRAMUSCULAR | Status: DC

## 2019-06-17 MED ORDER — MIDAZOLAM HCL 2 MG/2ML IJ SOLN
INTRAMUSCULAR | Status: AC
  Filled 2019-06-17: qty 2

## 2019-06-17 MED ORDER — LACTATED RINGERS IV SOLN
INTRAVENOUS | Status: DC | PRN
  Administered 2019-06-17 (×2): via INTRAVENOUS

## 2019-06-17 MED ORDER — FENTANYL CITRATE (PF) 100 MCG/2ML IJ SOLN
INTRAMUSCULAR | Status: DC | PRN
  Administered 2019-06-17: 100 ug via INTRAVENOUS
  Administered 2019-06-17: 50 ug via INTRAVENOUS
  Administered 2019-06-17: 100 ug via INTRAVENOUS

## 2019-06-17 MED ORDER — SODIUM CHLORIDE 0.9 % IV SOLN
2.0000 g | INTRAVENOUS | Status: AC
  Administered 2019-06-17: 2 g via INTRAVENOUS
  Filled 2019-06-17: qty 20

## 2019-06-17 MED ORDER — HYDROMORPHONE HCL 1 MG/ML IJ SOLN
INTRAMUSCULAR | Status: AC
  Administered 2019-06-17: 0.25 mg via INTRAVENOUS
  Filled 2019-06-17: qty 1

## 2019-06-17 MED ORDER — MIDAZOLAM HCL 5 MG/5ML IJ SOLN
INTRAMUSCULAR | Status: DC | PRN
  Administered 2019-06-17: 2 mg via INTRAVENOUS

## 2019-06-17 MED ORDER — ONDANSETRON HCL 4 MG PO TABS: 4.0000 mg | ORAL_TABLET | Freq: Three times a day (TID) | ORAL | 20 refills | Status: DC | PRN

## 2019-06-17 MED ORDER — ENSURE SURGERY PO LIQD
237.0000 mL | Freq: Two times a day (BID) | ORAL | Status: DC
  Administered 2019-06-18 – 2019-06-19 (×3): 237 mL via ORAL
  Filled 2019-06-17 (×5): qty 237

## 2019-06-17 MED ORDER — ONDANSETRON 4 MG PO TBDP: 4.0000 mg | ORAL_TABLET | Freq: Four times a day (QID) | ORAL | Status: DC | PRN

## 2019-06-17 MED ORDER — ROCURONIUM BROMIDE 10 MG/ML (PF) SYRINGE
PREFILLED_SYRINGE | INTRAVENOUS | Status: AC
  Filled 2019-06-17: qty 20

## 2019-06-17 MED ORDER — ALBUMIN HUMAN 5 % IV SOLN
12.5000 g | Freq: Four times a day (QID) | INTRAVENOUS | Status: DC | PRN
  Filled 2019-06-17: qty 250

## 2019-06-17 MED ORDER — BUPIVACAINE HCL (PF) 0.25 % IJ SOLN
INTRAMUSCULAR | Status: AC
  Filled 2019-06-17: qty 60

## 2019-06-17 MED ORDER — ACETAMINOPHEN 500 MG PO TABS
ORAL_TABLET | ORAL | Status: AC
  Administered 2019-06-17: 07:00:00 1000 mg via ORAL
  Filled 2019-06-17: qty 2

## 2019-06-17 MED ORDER — SODIUM CHLORIDE 0.9% IV SOLUTION: Freq: Once | INTRAVENOUS | Status: DC

## 2019-06-17 MED ORDER — EPHEDRINE SULFATE-NACL 50-0.9 MG/10ML-% IV SOSY
PREFILLED_SYRINGE | INTRAVENOUS | Status: DC | PRN
  Administered 2019-06-17 (×2): 10 mg via INTRAVENOUS

## 2019-06-17 MED ORDER — ONDANSETRON HCL 4 MG PO TABS: 4.0000 mg | ORAL_TABLET | Freq: Three times a day (TID) | ORAL | 5 refills | Status: DC | PRN

## 2019-06-17 MED ORDER — GABAPENTIN 300 MG PO CAPS
300.0000 mg | ORAL_CAPSULE | ORAL | Status: AC
  Administered 2019-06-17: 07:00:00 300 mg via ORAL

## 2019-06-17 MED ORDER — SODIUM CHLORIDE 0.9% FLUSH
3.0000 mL | Freq: Two times a day (BID) | INTRAVENOUS | Status: DC
  Administered 2019-06-17 – 2019-06-19 (×4): 3 mL via INTRAVENOUS

## 2019-06-17 MED ORDER — DEXAMETHASONE SODIUM PHOSPHATE 10 MG/ML IJ SOLN
INTRAMUSCULAR | Status: DC | PRN
  Administered 2019-06-17: 10 mg via INTRAVENOUS

## 2019-06-17 MED ORDER — SODIUM CHLORIDE 0.9% FLUSH: 3.0000 mL | INTRAVENOUS | Status: DC | PRN

## 2019-06-17 MED ORDER — BUPIVACAINE LIPOSOME 1.3 % IJ SUSP
INTRAMUSCULAR | Status: DC | PRN
  Administered 2019-06-17: 20 mL

## 2019-06-17 MED ORDER — SIMETHICONE 80 MG PO CHEW: 40.0000 mg | CHEWABLE_TABLET | Freq: Four times a day (QID) | ORAL | Status: DC | PRN

## 2019-06-17 MED ORDER — PHENOL 1.4 % MT LIQD: 1.0000 | OROMUCOSAL | Status: DC | PRN

## 2019-06-17 MED ORDER — GABAPENTIN 300 MG PO CAPS: 300.0000 mg | ORAL_CAPSULE | Freq: Two times a day (BID) | ORAL | Status: DC

## 2019-06-17 MED ORDER — ONDANSETRON HCL 4 MG/2ML IJ SOLN
INTRAMUSCULAR | Status: DC | PRN
  Administered 2019-06-17: 4 mg via INTRAVENOUS

## 2019-06-17 MED ORDER — HYDROCODONE-ACETAMINOPHEN 5-325 MG PO TABS: 1.0000 | ORAL_TABLET | Freq: Four times a day (QID) | ORAL | 0 refills | Status: DC | PRN

## 2019-06-17 MED ORDER — MENTHOL 3 MG MT LOZG: 1.0000 | LOZENGE | OROMUCOSAL | Status: DC | PRN

## 2019-06-17 MED ORDER — ONDANSETRON HCL 4 MG/2ML IJ SOLN: 4.0000 mg | Freq: Four times a day (QID) | INTRAMUSCULAR | Status: DC | PRN

## 2019-06-17 MED ORDER — ACETAMINOPHEN 500 MG PO TABS
1000.0000 mg | ORAL_TABLET | Freq: Three times a day (TID) | ORAL | Status: DC
  Administered 2019-06-18: 1000 mg via ORAL
  Filled 2019-06-17: qty 2

## 2019-06-17 MED ORDER — OXYCODONE HCL 5 MG/5ML PO SOLN: 5.0000 mg | Freq: Once | ORAL | Status: DC | PRN

## 2019-06-17 MED ORDER — PROCHLORPERAZINE MALEATE 10 MG PO TABS: 10.0000 mg | ORAL_TABLET | Freq: Four times a day (QID) | ORAL | Status: DC | PRN

## 2019-06-17 MED ORDER — GABAPENTIN 300 MG PO CAPS
ORAL_CAPSULE | ORAL | Status: AC
  Administered 2019-06-17: 300 mg via ORAL
  Filled 2019-06-17: qty 1

## 2019-06-17 MED ORDER — SUGAMMADEX SODIUM 200 MG/2ML IV SOLN
INTRAVENOUS | Status: DC | PRN
  Administered 2019-06-17: 200 mg via INTRAVENOUS

## 2019-06-17 MED ORDER — ROCURONIUM BROMIDE 10 MG/ML (PF) SYRINGE
PREFILLED_SYRINGE | INTRAVENOUS | Status: DC | PRN
  Administered 2019-06-17 (×3): 20 mg via INTRAVENOUS
  Administered 2019-06-17: 50 mg via INTRAVENOUS
  Administered 2019-06-17: 10 mg via INTRAVENOUS
  Administered 2019-06-17: 20 mg via INTRAVENOUS
  Administered 2019-06-17: 10 mg via INTRAVENOUS

## 2019-06-17 MED ORDER — HYDROCORTISONE 1 % EX CREA: 1.0000 "application " | TOPICAL_CREAM | Freq: Three times a day (TID) | CUTANEOUS | Status: DC | PRN

## 2019-06-17 MED ORDER — PANTOPRAZOLE SODIUM 40 MG PO TBEC: 40.0000 mg | DELAYED_RELEASE_TABLET | Freq: Every day | ORAL | Status: DC

## 2019-06-17 MED ORDER — GUAIFENESIN-DM 100-10 MG/5ML PO SYRP: 10.0000 mL | ORAL_SOLUTION | ORAL | Status: DC | PRN

## 2019-06-17 MED ORDER — LIP MEDEX EX OINT
1.0000 "application " | TOPICAL_OINTMENT | Freq: Two times a day (BID) | CUTANEOUS | Status: DC
  Administered 2019-06-17 – 2019-06-19 (×3): 1 via TOPICAL
  Filled 2019-06-17 (×2): qty 7

## 2019-06-17 MED ORDER — BUPIVACAINE LIPOSOME 1.3 % IJ SUSP
20.0000 mL | Freq: Once | INTRAMUSCULAR | Status: DC
  Filled 2019-06-17: qty 20

## 2019-06-17 MED ORDER — HYDROMORPHONE HCL 1 MG/ML IJ SOLN
0.5000 mg | INTRAMUSCULAR | Status: DC | PRN
  Administered 2019-06-17: 1 mg via INTRAVENOUS
  Filled 2019-06-17 (×2): qty 1

## 2019-06-17 MED ORDER — METRONIDAZOLE IN NACL 5-0.79 MG/ML-% IV SOLN
500.0000 mg | INTRAVENOUS | Status: AC
  Administered 2019-06-17: 09:00:00 500 mg via INTRAVENOUS
  Filled 2019-06-17: qty 100

## 2019-06-17 MED ORDER — ONDANSETRON HCL 4 MG/2ML IJ SOLN
INTRAMUSCULAR | Status: AC
  Filled 2019-06-17: qty 2

## 2019-06-17 MED ORDER — PROMETHAZINE HCL 25 MG/ML IJ SOLN: 6.2500 mg | INTRAMUSCULAR | Status: DC | PRN

## 2019-06-17 MED ORDER — 0.9 % SODIUM CHLORIDE (POUR BTL) OPTIME
TOPICAL | Status: DC | PRN
  Administered 2019-06-17: 1000 mL

## 2019-06-17 MED ORDER — MAGIC MOUTHWASH
15.0000 mL | Freq: Four times a day (QID) | ORAL | Status: DC | PRN
  Filled 2019-06-17: qty 15

## 2019-06-17 MED ORDER — BISACODYL 10 MG RE SUPP: 10.0000 mg | Freq: Every day | RECTAL | Status: DC | PRN

## 2019-06-17 MED ORDER — ACETAMINOPHEN 500 MG PO TABS
1000.0000 mg | ORAL_TABLET | Freq: Once | ORAL | Status: AC
  Administered 2019-06-17: 07:00:00 1000 mg via ORAL

## 2019-06-17 MED ORDER — HYDROMORPHONE HCL 1 MG/ML IJ SOLN
0.2500 mg | INTRAMUSCULAR | Status: DC | PRN
  Administered 2019-06-17 (×4): 0.25 mg via INTRAVENOUS

## 2019-06-17 MED ORDER — PROPOFOL 10 MG/ML IV BOLUS
INTRAVENOUS | Status: AC
  Filled 2019-06-17: qty 20

## 2019-06-17 MED ORDER — PHENYLEPHRINE 40 MCG/ML (10ML) SYRINGE FOR IV PUSH (FOR BLOOD PRESSURE SUPPORT)
PREFILLED_SYRINGE | INTRAVENOUS | Status: DC | PRN
  Administered 2019-06-17: 80 ug via INTRAVENOUS

## 2019-06-17 MED ORDER — CELECOXIB 200 MG PO CAPS
ORAL_CAPSULE | ORAL | Status: AC
  Administered 2019-06-17: 200 mg via ORAL
  Filled 2019-06-17: qty 1

## 2019-06-17 MED ORDER — PROMETHAZINE HCL 25 MG RE SUPP: 25.0000 mg | Freq: Four times a day (QID) | RECTAL | 5 refills | Status: DC | PRN

## 2019-06-17 MED ORDER — SODIUM CHLORIDE 0.9 % IV SOLN: 250.0000 mL | INTRAVENOUS | Status: DC | PRN

## 2019-06-17 MED ORDER — FENTANYL CITRATE (PF) 250 MCG/5ML IJ SOLN
INTRAMUSCULAR | Status: AC
  Filled 2019-06-17: qty 5

## 2019-06-17 MED ORDER — HYDRALAZINE HCL 20 MG/ML IJ SOLN: 5.0000 mg | INTRAMUSCULAR | Status: DC | PRN

## 2019-06-17 MED ORDER — DIPHENHYDRAMINE HCL 50 MG/ML IJ SOLN: 12.5000 mg | Freq: Four times a day (QID) | INTRAMUSCULAR | Status: DC | PRN

## 2019-06-17 MED ORDER — LISINOPRIL 10 MG PO TABS
10.0000 mg | ORAL_TABLET | Freq: Every day | ORAL | Status: DC
  Administered 2019-06-17 – 2019-06-19 (×3): 10 mg via ORAL
  Filled 2019-06-17 (×3): qty 1

## 2019-06-17 MED ORDER — LIDOCAINE 2% (20 MG/ML) 5 ML SYRINGE
INTRAMUSCULAR | Status: DC | PRN
  Administered 2019-06-17: 60 mg via INTRAVENOUS

## 2019-06-17 MED ORDER — FENTANYL CITRATE (PF) 100 MCG/2ML IJ SOLN
INTRAMUSCULAR | Status: AC
  Filled 2019-06-17: qty 2

## 2019-06-17 MED ORDER — ACETAMINOPHEN 500 MG PO TABS: 1000.0000 mg | ORAL_TABLET | ORAL | Status: AC

## 2019-06-17 MED ORDER — EPHEDRINE SULFATE 50 MG/ML IJ SOLN
INTRAMUSCULAR | Status: DC | PRN
  Administered 2019-06-17: 5 mg via INTRAVENOUS

## 2019-06-17 MED ORDER — METOPROLOL TARTRATE 5 MG/5ML IV SOLN: 5.0000 mg | Freq: Four times a day (QID) | INTRAVENOUS | Status: DC | PRN

## 2019-06-17 MED ORDER — PANTOPRAZOLE SODIUM 40 MG PO PACK
40.0000 mg | PACK | Freq: Every day | ORAL | Status: DC
  Administered 2019-06-18 – 2019-06-19 (×2): 40 mg via ORAL
  Filled 2019-06-17 (×2): qty 20

## 2019-06-17 MED ORDER — SCOPOLAMINE 1 MG/3DAYS TD PT72
1.0000 | MEDICATED_PATCH | TRANSDERMAL | Status: DC
  Administered 2019-06-17: 07:00:00 1.5 mg via TRANSDERMAL

## 2019-06-17 MED ORDER — KETAMINE HCL 10 MG/ML IJ SOLN
INTRAMUSCULAR | Status: DC | PRN
  Administered 2019-06-17: 30 mg via INTRAVENOUS

## 2019-06-17 MED ORDER — POLYETHYLENE GLYCOL 3350 17 G PO PACK: 17.0000 g | PACK | Freq: Every day | ORAL | Status: DC | PRN

## 2019-06-17 MED ORDER — DIPHENHYDRAMINE HCL 12.5 MG/5ML PO ELIX: 12.5000 mg | ORAL_SOLUTION | Freq: Four times a day (QID) | ORAL | Status: DC | PRN

## 2019-06-17 MED ORDER — CEFAZOLIN SODIUM-DEXTROSE 2-4 GM/100ML-% IV SOLN
2.0000 g | Freq: Three times a day (TID) | INTRAVENOUS | Status: AC
  Administered 2019-06-17: 16:00:00 2 g via INTRAVENOUS
  Filled 2019-06-17: qty 100

## 2019-06-17 MED ORDER — SODIUM CHLORIDE 0.9 % IV SOLN
INTRAVENOUS | Status: AC
  Administered 2019-06-17: 15:00:00 via INTRAVENOUS

## 2019-06-17 MED ORDER — LIDOCAINE 2% (20 MG/ML) 5 ML SYRINGE
INTRAMUSCULAR | Status: DC | PRN
  Administered 2019-06-17: 5.6 mL/h via INTRAVENOUS

## 2019-06-17 MED ORDER — LISINOPRIL 10 MG PO TABS: 10.0000 mg | ORAL_TABLET | Freq: Every day | ORAL | Status: DC

## 2019-06-17 MED ORDER — SUCCINYLCHOLINE CHLORIDE 20 MG/ML IJ SOLN
INTRAMUSCULAR | Status: DC | PRN
  Administered 2019-06-17: 120 mg via INTRAVENOUS

## 2019-06-17 MED ORDER — METHOCARBAMOL 500 MG PO TABS: 750.0000 mg | ORAL_TABLET | Freq: Four times a day (QID) | ORAL | Status: DC | PRN

## 2019-06-17 MED ORDER — BUPIVACAINE-EPINEPHRINE 0.25% -1:200000 IJ SOLN
INTRAMUSCULAR | Status: DC | PRN
  Administered 2019-06-17: 60 mL

## 2019-06-17 MED ORDER — CELECOXIB 200 MG PO CAPS
200.0000 mg | ORAL_CAPSULE | ORAL | Status: AC
  Administered 2019-06-17: 07:00:00 200 mg via ORAL

## 2019-06-17 SURGICAL SUPPLY — 64 items
APPLICATOR COTTON TIP 6 STRL (MISCELLANEOUS) ×1 IMPLANT
APPLICATOR COTTON TIP 6IN STRL (MISCELLANEOUS) ×2
APPLIER CLIP 5 13 M/L LIGAMAX5 (MISCELLANEOUS)
BLADE SURG SZ11 CARB STEEL (BLADE) ×2 IMPLANT
CHLORAPREP W/TINT 26 (MISCELLANEOUS) ×2 IMPLANT
CLIP APPLIE 5 13 M/L LIGAMAX5 (MISCELLANEOUS) IMPLANT
COVER SURGICAL LIGHT HANDLE (MISCELLANEOUS) ×2 IMPLANT
COVER TIP SHEARS 8 DVNC (MISCELLANEOUS) IMPLANT
COVER TIP SHEARS 8MM DA VINCI (MISCELLANEOUS)
COVER WAND RF STERILE (DRAPES) ×2 IMPLANT
DECANTER SPIKE VIAL GLASS SM (MISCELLANEOUS) ×2 IMPLANT
DRAIN CHANNEL 19F RND (DRAIN) ×2 IMPLANT
DRAIN PENROSE 18X1/2 LTX STRL (DRAIN) IMPLANT
DRAPE ARM DVNC X/XI (DISPOSABLE) ×4 IMPLANT
DRAPE COLUMN DVNC XI (DISPOSABLE) ×1 IMPLANT
DRAPE DA VINCI XI ARM (DISPOSABLE) ×4
DRAPE DA VINCI XI COLUMN (DISPOSABLE) ×1
DRAPE WARM FLUID 44X44 (DRAPES) ×2 IMPLANT
DRSG TEGADERM 2-3/8X2-3/4 SM (GAUZE/BANDAGES/DRESSINGS) ×2 IMPLANT
DRSG TEGADERM 4X4.75 (GAUZE/BANDAGES/DRESSINGS) ×2 IMPLANT
ELECT REM PT RETURN 15FT ADLT (MISCELLANEOUS) ×2 IMPLANT
ENDOLOOP SUT PDS II  0 18 (SUTURE)
ENDOLOOP SUT PDS II 0 18 (SUTURE) IMPLANT
EVACUATOR DRAINAGE 10X20 100CC (DRAIN) IMPLANT
EVACUATOR SILICONE 100CC (DRAIN) ×2 IMPLANT
FELT TEFLON 4 X1 (Mesh General) ×2 IMPLANT
GAUZE SPONGE 2X2 8PLY STRL LF (GAUZE/BANDAGES/DRESSINGS) ×1 IMPLANT
GLOVE ECLIPSE 8.0 STRL XLNG CF (GLOVE) ×4 IMPLANT
GLOVE INDICATOR 8.0 STRL GRN (GLOVE) ×4 IMPLANT
GOWN STRL REUS W/TWL XL LVL3 (GOWN DISPOSABLE) ×6 IMPLANT
GRASPER SUT TROCAR 14GX15 (MISCELLANEOUS) IMPLANT
IRRIG SUCT STRYKERFLOW 2 WTIP (MISCELLANEOUS) ×2
IRRIGATION SUCT STRKRFLW 2 WTP (MISCELLANEOUS) ×1 IMPLANT
KIT BASIN OR (CUSTOM PROCEDURE TRAY) ×2 IMPLANT
KIT TURNOVER KIT A (KITS) IMPLANT
MESH PHASIX RESORB RECT 10X15 (Mesh General) ×2 IMPLANT
MESH ULTRAPRO 3X6 7.6X15CM (Mesh General) ×2 IMPLANT
NEEDLE HYPO 22GX1.5 SAFETY (NEEDLE) ×2 IMPLANT
PACK CARDIOVASCULAR III (CUSTOM PROCEDURE TRAY) ×2 IMPLANT
PAD POSITIONING PINK XL (MISCELLANEOUS) ×2 IMPLANT
SCISSORS LAP 5X45 EPIX DISP (ENDOMECHANICALS) IMPLANT
SEAL CANN UNIV 5-8 DVNC XI (MISCELLANEOUS) ×4 IMPLANT
SEAL XI 5MM-8MM UNIVERSAL (MISCELLANEOUS) ×4
SEALER VESSEL DA VINCI XI (MISCELLANEOUS) ×1
SEALER VESSEL EXT DVNC XI (MISCELLANEOUS) ×1 IMPLANT
SET TUBE SMOKE EVAC HIGH FLOW (TUBING) ×2 IMPLANT
SOLUTION ELECTROLUBE (MISCELLANEOUS) ×2 IMPLANT
SPONGE GAUZE 2X2 STER 10/PKG (GAUZE/BANDAGES/DRESSINGS) ×1
SPONGE LAP 18X18 RF (DISPOSABLE) ×2 IMPLANT
SUT ETHIBOND 0 36 GRN (SUTURE) ×6 IMPLANT
SUT ETHIBOND NAB CT1 #1 30IN (SUTURE) ×6 IMPLANT
SUT MNCRL AB 4-0 PS2 18 (SUTURE) ×2 IMPLANT
SUT PROLENE 2 0 SH DA (SUTURE) IMPLANT
SUT VICRYL 0 TIES 12 18 (SUTURE) IMPLANT
SUT VICRYL 0 UR6 27IN ABS (SUTURE) IMPLANT
SUT VLOC 180 2-0 9IN GS21 (SUTURE) ×2 IMPLANT
SYR 10ML LL (SYRINGE) ×2 IMPLANT
SYR 20ML LL LF (SYRINGE) ×2 IMPLANT
SYRINGE IRR TOOMEY STRL 70CC (SYRINGE) ×2 IMPLANT
TOWEL OR 17X26 10 PK STRL BLUE (TOWEL DISPOSABLE) ×2 IMPLANT
TOWEL OR NON WOVEN STRL DISP B (DISPOSABLE) ×2 IMPLANT
TRAY FOLEY MTR SLVR 14FR STAT (SET/KITS/TRAYS/PACK) IMPLANT
TRAY FOLEY MTR SLVR 16FR STAT (SET/KITS/TRAYS/PACK) IMPLANT
TROCAR ADV FIXATION 5X100MM (TROCAR) ×2 IMPLANT

## 2019-06-17 NOTE — Interval H&P Note (Signed)
History and Physical Interval Note:  06/17/2019 7:21 AM  Randy Larson  has presented today for surgery, with the diagnosis of PARAESOPHAGEAL HIATAL HERNIA REFRACTORY TO MEDICAL MANAGEMENT.  Manometry w good esophageal function.  The various methods of treatment have been discussed with the patient and family. After consideration of risks, benefits and other options for treatment, the patient has consented to  Procedure(s): ROBOTIC REPAIR OF PARAESOPHAGEAL HIATAL HERNIA WITH FUNDOPLICATION (N/A) as a surgical intervention.  The patient's history has been reviewed, patient examined, no change in status, stable for surgery.  I have reviewed the patient's chart and labs.  Questions were answered to the patient's satisfaction.    I have re-reviewed the the patient's records, history, medications, and allergies.  I have re-examined the patient.  I again discussed intraoperative plans and goals of post-operative recovery.  The patient agrees to proceed.  Randy Larson  [birth year] [id number]  Patient Care Team: Serita Grammes, MD as PCP - General (Family Medicine) Michael Boston, MD as Consulting Physician (General Surgery) Jackquline Denmark, MD as Consulting Physician (Gastroenterology) Park Liter, MD as Consulting Physician (Cardiology) Vickey Huger, MD as Consulting Physician (Orthopedic Surgery)  Patient Active Problem List   Diagnosis Date Noted  . Incarcerated hiatal hernia containing stomach, colon, and pancreas 12/02/2018    Priority: High  . Hiatal hernia   . Gastroesophageal reflux disease   . Dizziness 04/21/2019  . Sinus bradycardia 04/21/2019  . History of colonic polyps 12/02/2018  . Dark stools 12/02/2018  . Contusion of left elbow 07/24/2018  . Bilateral chronic knee pain 01/24/2017  . Essential hypertension 11/09/2015  . Dyslipidemia 11/09/2015  . Atypical chest pain 11/09/2015    Past Medical History:  Diagnosis Date  . Atypical chest pain 11/09/2015  .  Bilateral chronic knee pain 01/24/2017  . Contusion of left elbow 07/24/2018  . Dark stools 12/02/2018  . Dyslipidemia 11/09/2015  . Essential hypertension 11/09/2015  . GERD (gastroesophageal reflux disease)   . Hemorrhoids   . History of colonic polyps 12/02/2018  . History of pneumothorax   . Incarcerated hiatal hernia containing stomach, colon, and pancreas 12/02/2018    Past Surgical History:  Procedure Laterality Date  . COLONOSCOPY  02/04/2009   Colonic polyps. Mild sigmoid diverticulosis. Small internal hemorrhoids.   . ESOPHAGEAL MANOMETRY N/A 04/13/2019   Procedure: ESOPHAGEAL MANOMETRY (EM);  Surgeon: Mauri Pole, MD;  Location: [facility]COPY;  Service: Endoscopy;  Laterality: N/A;    Social History   Socioeconomic History  . Marital status: Married    Spouse name: Not on file  . Number of children: Not on file  . Years of education: Not on file  . Highest education level: Not on file  Occupational History  . Not on file  Social Needs  . Financial resource strain: Not on file  . Food insecurity    Worry: Not on file    Inability: Not on file  . Transportation needs    Medical: Not on file    Non-medical: Not on file  Tobacco Use  . Smoking status: Former Smoker    Types: Cigarettes  . Smokeless tobacco: Never Used  Substance and Sexual Activity  . Alcohol use: Yes    Comment: Rare/special occasion  . Drug use: Not Currently  . Sexual activity: Not on file  Lifestyle  . Physical activity    Days per week: Not on file    Minutes per session: Not on file  . Stress: Not on  file  Relationships  . Social Herbalist on phone: Not on file    Gets together: Not on file    Attends religious service: Not on file    Active member of club or organization: Not on file    Attends meetings of clubs or organizations: Not on file    Relationship status: Not on file  . Intimate partner violence    Fear of current or ex partner: Not on file    Emotionally  abused: Not on file    Physically abused: Not on file    Forced sexual activity: Not on file  Other Topics Concern  . Not on file  Social History Narrative  . Not on file    Family History  Problem Relation Age of Onset  . Diabetes Mother   . Hypertension Mother   . Heart disease Mother   . Congestive Heart Failure Mother   . Congestive Heart Failure Father     Medications Prior to Admission  Medication Sig Dispense Refill Last Dose  . lisinopril (ZESTRIL) 10 MG tablet Take 10 mg by mouth daily.    06/16/2019 at Unknown time  . pantoprazole (PROTONIX) 40 MG tablet Take 40 mg by mouth daily.    06/17/2019 at 0500    Current Facility-Administered Medications  Medication Dose Route Frequency Provider Last Rate Last Dose  . bupivacaine liposome (EXPAREL) 1.3 % injection 266 mg  20 mL Infiltration Once Michael Boston, MD      . cefTRIAXone (ROCEPHIN) 2 g in sodium chloride 0.9 % 100 mL IVPB  2 g Intravenous On Call to OR Michael Boston, MD       And  . metroNIDAZOLE (FLAGYL) IVPB 500 mg  500 mg Intravenous On Call to OR Michael Boston, MD      . dexamethasone (DECADRON) injection 4 mg  4 mg Intravenous On Call to OR Michael Boston, MD      . lactated ringers infusion   Intravenous Continuous Myrtie Soman, MD 50 mL/hr at 06/17/19 [id number]    . scopolamine (TRANSDERM-SCOP) 1 MG/3DAYS 1.5 mg  1 patch Transdermal On Call to OR Michael Boston, MD   1.5 mg at 06/17/19 0645     No Known Allergies  BP (!) 148/91   Pulse (!) 57   Temp 97.7 F (36.5 C) (Oral)   Resp 14   Ht 5\' 11"  (1.803 m)   Wt 79.3 kg   SpO2 100%   BMI 24.38 kg/m   Labs: No results found for this or any previous visit (from the past 48 hour(s)).  Imaging / Studies: No results found.   Adin Hector, M.D., F.A.C.S. Gastrointestinal and Minimally Invasive Surgery [facility] [address], [place], [place] [address] [phone] Main / Paging  06/17/2019 7:22 AM    Adin Hector

## 2019-06-17 NOTE — Anesthesia Procedure Notes (Signed)
Procedure Name: Intubation Performed by: Gean Maidens, CRNA Pre-anesthesia Checklist: Patient identified, Emergency Drugs available, Suction available, Patient being monitored and Timeout performed Patient Re-evaluated:Patient Re-evaluated prior to induction Oxygen Delivery Method: Circle system utilized Preoxygenation: Pre-oxygenation with 100% oxygen Induction Type: IV induction Ventilation: Mask ventilation without difficulty Laryngoscope Size: Mac and 3 Grade View: Grade II Tube type: Oral Tube size: 7.5 mm Number of attempts: 1 Airway Equipment and Method: Stylet Placement Confirmation: ETT inserted through vocal cords under direct vision,  positive ETCO2 and breath sounds checked- equal and bilateral Secured at: 23 cm Tube secured with: Tape Dental Injury: Teeth and Oropharynx as per pre-operative assessment

## 2019-06-17 NOTE — Op Note (Addendum)
06/17/2019  1:26 PM  PATIENT:  Randy Larson  63 y.o. male  Patient Care Team: Buckner Malta, MD as PCP - General (Family Medicine) Karie Soda, MD as Consulting Physician (General Surgery) Lynann Bologna, MD as Consulting Physician (Gastroenterology) Georgeanna Lea, MD as Consulting Physician (Cardiology) Dannielle Huh, MD as Consulting Physician (Orthopedic Surgery)  PRE-OPERATIVE DIAGNOSIS:  INCARCERATED HIATAL HERNIA REFRACTORY TO MEDICAL MANAGEMENT  POST-OPERATIVE DIAGNOSIS:  INCARCERATED HIATAL HERNIA REFRACTORY TO MEDICAL MANAGEMENT  PROCEDURE:   XI ROBOTIC PRIMARY REPAIR OF PARAESOPHAGEAL HIATAL HERNIA PHASIX MESH REINFORCEMENT DIAPHRAGM RELAXING INCISION WITH MESH REPAIR TOUPET FUNDOPLICATION (Posterior 240 degree partial fundoplication) Type II mediastinal dissection.  SURGEON:  Ardeth Sportsman, MD  ASSIST:  Romie Levee, MD, FACS. An experienced assistant was required given the standard of surgical care given the complexity of the case.  This assistant was needed for exposure, dissection, suctioning, retraction, instrument exchange, etc.  ANESTHESIA:   local and general   Nerve block provided with liposomal bupivacaine (Experel) mixed with 0.25% bupivacaine as a Bilateral TAP block x 30mL each side at the level of the subcostal ridges going down to the transverse abdominis & preperitoneal spaces along the flank at the anterior axillary line.  EBL:  Total I/O In: 3000 [I.V.:3000] Out: 300 [Urine:275; Blood:25]  Delay start of Pharmacological VTE agent (>24hrs) due to surgical blood loss or risk of bleeding:  no  ANESTHESIA: 1. General anesthesia. 2. Local anesthetic in a field block around all port sites.  SPECIMEN:  Mediastinal hernia sac (not sent).  DRAINS:  A 19-French Blake drain goes from the right upper quadrant along the greater curvature of the stomach into the mediastinum.  COUNTS:  YES  PLAN OF CARE: Admit to inpatient   PATIENT  DISPOSITION:  PACU - hemodynamically stable.  INDICATION:   Patient with symptomatic paraesophageal hiatal hernia.  All the stomach, part of the transverse colon and tail of pancreas up in the chest.  The patient has had extensive work-up & we feel the patient will benefit from repair:  The anatomy & physiology of the foregut and anti-reflux mechanism was discussed.  The pathophysiology of hiatal herniation and GERD was discussed.  Natural history risks without surgery was discussed.   The patient's symptoms are not adequately controlled by medicines and other non-operative treatments.  I feel the risks of no intervention will lead to serious problems that outweigh the operative risks; therefore, I recommended surgery to reduce the hiatal hernia out of the chest and fundoplication to rebuild the anti-reflux valve and control reflux better.  Need for a thorough workup to rule out the differential diagnosis and plan treatment was explained.  I explained laparoscopic techniques with possible need for an open approach.  Risks such as bleeding, infection, abscess, leak, need for further treatment, heart attack, death, and other risks were discussed.   I noted a good likelihood this will help address the problem.  Goals of post-operative recovery were discussed as well.  Possibility that this will not correct all symptoms was explained.  Post-operative dysphagia, need for short-term liquid & pureed diet, inability to vomit, possibility of reherniation, possible need for medicines to help control symptoms in addition to surgery were discussed.  We will work to minimize complications.   Educational handouts further explaining the pathology, treatment options, and dysphagia diet was given as well.  Questions were answered.  The patient expresses understanding & wishes to proceed with surgery.  OR FINDINGS:  Large paraesophageal hiatal hernia with 100%  of the stomach in the mediastinum.  Most of the greater omentum  and part of the mid transverse colon and tail of pancreas as well.  There was a 12 x 12 cm hiatal defect.  Relaxing incision of left diaphragm in sagittal plane 7 cm lateral and parallel to the left crus.  That gave a 8 x 4 cm diaphragmatic defect.  This allowed hiatal closure without tension.  Diaphragm defect patched with Ultrapro (ultra lightweight polypropylene) mesh 15 x 10 cm transversely, the most medial corner 3 cm lateral to the posterior hiatal hernia closure  It is a primary repair over pledgets. Mesh reinforcement of the hiatal closure was used with Mesh was used: PhasixT Mesh (a knitted monofilament mesh scaffold using Poly-4-hydroxybutyrate (P4HB), a biologically derived, fully resorbable material)  The patient has a 4 cm Toupet (partial posterior 240 degree) fundoplication bougie.  The patient has had anterior and posterior gastropexies.  DESCRIPTION:   Informed consent was confirmed.  The patient received IV antibiotics prior to incision.  The underwent general anesthesia without difficulty.  A Foley catheter sterilely placed.  The patient was positioned in split leg with arms tucked. The abdomen was prepped and draped in the sterile fashion.  Surgical time-out confirmed our plan.  I placed a 8 mm robotic port in the left subcostal region using Varess entry technique with the patient in steep reverse Trendelenburg and left side up.  Entry was clean.  We induced carbon dioxide insufflation.  Camera inspection revealed no injury.  Under direct visualization, I placed extra ports.  I also placed a 5 mm port in the left subxiphoid region under direct visualization.  I removed that and placed an Omega-shaped rigid Nathanson liver retractor to lift the left lateral sector of the liver anteriorly to expose the esophageal hiatus.  This was secured to the bed using the iron man system.  The Xi robot was carefully docked and instruments placed and advanced under direct visualization.  We focused  on dissection.  Patient had an large hiatal defect is anticipated.  Most of greater omentum up in there.  Try to reduce some of that out but there was a strong suction cup affect and most contents were up in the mediastinum.  We grasped the anterior mediastinal sac at the apex of the crus.  I scored through that and got into the anterior mediastinum.  I was able to free the mediastinal sac from its attachments to the pericardium and bilateral pleura using primarily focused gentle blunt dissection as well as focused vessel sealer dissection.  This took some time as the hernia sac was rather thin-walled.  End up having to come around the right and left crura to help release some of the sac as well.  This allowed [place] to reduce some greater omentum out.  I transected phrenoesophageal attachments to the inner right crus, preserving a two centimeter cuff of mediastinal sac until I found the base of the crura.  I then came around anteriorly on the left side and freed up the phrenoesophageal attachments of the mediastinal sac on the medial part of the left crus on the superior half.  I did careful mediastinal dissection to free the mediastinal sac.  With that, we could relieve the suction cup affect of the hernia sac and help reduce the stomach back down into the abdomen, flipped back approriately.    We ligated the short gastrics along the lesser curvature of the stomach about a third the way down and  then came up proximally over the fundus.  We released the attachments of the stomach to the retroperitoneum until we were able to connect with the prior dissection on the left crus.  We completed the release of phrenoesophageal attachments to the medial part of the left crus down to its base.  Freed off some attachments between the pancreatic tail in the spleen to help get the tail the pancreas back out of the mediastinum into the intra-abdominal cavity.  Eventually mobilized that off the left crura.  With this, we had  circumferential mobilization.    We placed the stomach and esophagus on axial tension.  I then did a Type II mediastinal dissection where I freed the esophagus from its attachments to the aorta, spine, pleura, and pericardium using primarily gentle blunt as well as focused ultrasonic dissection.  It was densely adherent to the aorta and spine. We saw the anterior & posterior vagus nerves intact.  We preserved it at all times.  I procedded to dissect about 20 cm proximally into the mediastinum.  With that I could straighten out the esophagus and get 5 cm of intra-abdominal length of the esophagus at a best estimation.  I freed the anterior mediastinal sac off the esophagus & stomach.  We saw the anterior vagus nerve and freed the sac off of the vagus.  I dissected out & removed the fatty epiphrenic pads at the esophagogastric junction. With that, I could better define the esophagogastric junction.  I confirmed the the patient had 5 cm of intra-abdominal esophageal length off tension.  I brought the fundus of the stomach posterior to the esophagus over to the right side.  The wrap was mobile with the classic shoe shine maneuver.  Wrap became together gently.  I freed off a few more fatty epiphrenic pads that were within this wrap.  We reflected the stomach left laterally to see the hiatal defect.  It was large and stiff.  I did free most lateral sector of the liver off the right crus as well as a few adhesions.  Then got a little more mobility on the right crus but the left crus was still rather fixed.  I therefore did a relaxing incision into the left diaphragm about 7 cm lateral to the crus above the lateral spleen.  I tried to just release one fibrotic layer but the diaphragm was extremely thin ended up coming through it.  However that a lot of tension came off.  The crura could better come together.  I closed the esophageal hiatus using 0 Ethibond stitch using horizontal mattress stitches with pledgets on  both sides.  I did that x3 stitches.  The crura had good substance and they came together well without any tension.  I closed the left mid diaphragmatic defect with a bridge of ultra Pro mesh.  15 x 10 cm sheet.  Brought in and laid it on the left mid posterior diaphragm.  Secured it with interrupted 0 Ethibond sutures posteriorly as the diaphragm came into the retroperitoneum interrupted x3.  Another stitch superomedially and 3 more more anteriorly.  Then rolled part of the mesh on itself and secured it on its medial edge such that the mesh was 3 cm away from the hiatal closure and not exposed to any stomach and especially esophagus.  With that that bridge the defect well.  The spleen and consensually was abutting up against it.  Because of the larger hiatal defect, I reinforced the repair using a 15  x 10 cm biologic Phasix mesh.  I cut out a 2x4 cm part of mesh in the middle third of the mesh such that the mesh had a broad U shape transversely, one tail 5 cm wide & the other 10cm.  We brought the mesh in and laid it over the crural repair, tails anterior over the crura.  I tucked the broader "U" tail of the mesh between the left diaphragm and the spleen, the narrower "U" tail over the right crus.  I then secured the posterior & anterior corners of the narrower"U" tail with 0 Ethibond upper interrupted suture to the right crus.  I secured to the left lateral and left superior sides of the broader "U" tail to the left diaphragm band with 2-0 V lock running suture.   The did this to incorporate with the ultra pro mesh and have stitches lateral to the diaphragmatic defect.  I brought the fundus of the stomach behind the esophagus and cardia to set up a fundoplication wrap.  I did a posterior gastropexy by taking of #1 Ethibond stitches to the posterior part of the right side of the wrap and thru the crural closure anterior to the most anterior pledgets.  Therefore no esophagus was exposed any pledget.  I then  placed a stitch in the right anterior superior part of the wrap to the right anterior crura.  I placed a similar stitch on the left anterior side as well.  That way the stomach covered the mesh and protected it from any esophageal exposure.  With the anterior and posterior gastropexy's, stomach laid well for a fundoplication wrap.  This esophagus was somewhat beaten up from the dissection.  I did use a 4-0 Vicryl horizontal mattress suture to bring some external vertical muscle fibers to get her anteriorly.  I decided to do a partial posterior fundoplication with a Toupet wrap.  I did a stitch on the left anterior part of the stomach wrap to the left anterolateral esophagus.  Did a similar stitch on the right side and a mirror image fashion.  I did 2 more pairs of stitches more distally such that I had a 4 cm long partial posterior fundoplication.  Involving about 240 degrees of the posterior circumference.  Clamped off the mid body of the stomach and had anesthesia insufflate the esophagus through an orogastric tube while esophagus and stomach was insufflated.  There was a negative air leak test.  Tissues looked viable.  The wrap was soft and floppy.    Removed all the needles and excess suture and hernia sac direct realization.  Needle counts correct.  I placed a drain as noted above.  I did irrigation and ensured hemostasis.  I saw no evidence of any leak or perforation or other abnormality.  I removed the [facility] liver retractor under direct visualization.  I evacuated carbon dioxide and removed the ports.  The skin was closed with Monocryl and sterile dressings applied.  Drain secured with 2-0 Prolene suture.  The patient is being extubated and brought back to the recovery room.  Blood loss was minimal.  No pulmonary or cardiac issues.  I discussed postop care in detail with the patient and family in in the office.  Discussed again with the patient in the holding area.  I made an attempt to locate  family to discuss patient's status and recommendations.  I tried to reach his spouse on her mobile phone.  No answer.  No one is available at this  time.  I will try again later   Ardeth Sportsman, M.D., F.A.C.S. Gastrointestinal and Minimally Invasive Surgery [facility] [address], [address] [place], [place] [address] [phone] Main / Paging

## 2019-06-17 NOTE — Transfer of Care (Signed)
Immediate Anesthesia Transfer of Care Note  Patient: Randy Larson  Procedure(s) Performed: XI ROBOTIC REPAIR OF PARAESOPHAGEAL HIATAL HERNIA WITH TOUPET FUNDOPLICATION AND DIAPHRAGM RELAXING INCISION WITH MESH REPAIR (N/A Abdomen)  Patient Location: PACU  Anesthesia Type:General  Level of Consciousness: sedated, patient cooperative and responds to stimulation  Airway & Oxygen Therapy: Patient Spontanous Breathing and Patient connected to face mask oxygen  Post-op Assessment: Report given to RN and Post -op Vital signs reviewed and stable  Post vital signs: Reviewed and stable  Last Vitals:  Vitals Value Taken Time  BP 151/94 06/17/19 1330  Temp    Pulse 68 06/17/19 1333  Resp 18 06/17/19 1333  SpO2 100 % 06/17/19 1333  Vitals shown include unvalidated device data.  Last Pain:  Vitals:   06/17/19 0636  TempSrc:   PainSc: 0-No pain      Patients Stated Pain Goal: 4 ([id number])  Complications: No apparent anesthesia complications

## 2019-06-17 NOTE — Anesthesia Postprocedure Evaluation (Signed)
Anesthesia Post Note  Patient: Randy Larson  Procedure(s) Performed: XI ROBOTIC REPAIR OF PARAESOPHAGEAL HIATAL HERNIA WITH TOUPET FUNDOPLICATION AND DIAPHRAGM RELAXING INCISION WITH MESH REPAIR (N/A Abdomen)     Patient location during evaluation: PACU Anesthesia Type: General Level of consciousness: awake and alert Pain management: pain level controlled Vital Signs Assessment: post-procedure vital signs reviewed and stable Respiratory status: spontaneous breathing, nonlabored ventilation, respiratory function stable and patient connected to nasal cannula oxygen Cardiovascular status: blood pressure returned to baseline and stable Postop Assessment: no apparent nausea or vomiting Anesthetic complications: no    Last Vitals:  Vitals:   06/17/19 1400 06/17/19 1415  BP: (!) 152/95 (!) 143/94  Pulse: 63 71  Resp: 20 16  Temp: (!) 36.2 C (!) 36.3 C  SpO2: 100% 100%    Last Pain:  Vitals:   06/17/19 1345  TempSrc:   PainSc: [place]

## 2019-06-17 NOTE — Discharge Instructions (Signed)
EATING AFTER YOUR ESOPHAGEAL SURGERY (Stomach Fundoplication, Hiatal Hernia repair, Achalasia surgery, etc)  ######################################################################  EAT Start with a pureed / full liquid diet (see below) Gradually transition to a high fiber diet with a fiber supplement over the next month after discharge.    WALK Walk an hour a day.  Control your pain to do that.    CONTROL PAIN Control pain so that you can walk, sleep, tolerate sneezing/coughing, go up/down stairs.  HAVE A BOWEL MOVEMENT DAILY Keep your bowels regular to avoid problems.  OK to try a laxative to override constipation.  OK to use an antidairrheal to slow down diarrhea.  Call if not better after 2 tries  CALL IF YOU HAVE PROBLEMS/CONCERNS Call if you are still struggling despite following these instructions. Call if you have concerns not answered by these instructions  ######################################################################   After your esophageal surgery, expect some sticking with swallowing over the next 1-2 months.    If food sticks when you eat, it is called "dysphagia".  This is due to swelling around your esophagus at the wrap & hiatal diaphragm repair.  It will gradually ease off over the next few months.  To help you through this temporary phase, we start you out on a pureed (blenderized) diet.  Your first meal in the hospital was thin liquids.  You should have been given a pureed diet by the time you left the hospital.  We ask patients to stay on a pureed diet for the first 2-3 weeks to avoid anything getting "stuck" near your recent surgery.  Don't be alarmed if your ability to swallow doesn't progress according to this plan.  Everyone is different and some diets can advance more or less quickly.    It is often helpful to crush your medications or split them as they can sometimes stick, especially the first week or so.   Some BASIC RULES to follow  are:  Maintain an upright position whenever eating or drinking.  Take small bites - just a teaspoon size bite at a time.  Eat slowly.  It may also help to eat only one food at a time.  Consider nibbling through smaller, more frequent meals & avoid the urge to eat BIG meals  Do not push through feelings of fullness, nausea, or bloatedness  Do not mix solid foods and liquids in the same mouthful  Try not to "wash foods down" with large gulps of liquids.  Avoid carbonated (bubbly/fizzy) drinks.    Avoid foods that make you feel gassy or bloated.  Start with bland foods first.  Wait on trying greasy, fried, or spicy meals until you are tolerating more bland solids well.  Understand that it will be hard to burp and belch at first.  This gradually improves with time.  Expect to be more gassy/flatulent/bloated initially.  Walking will help your body manage it better.  Consider using medications for bloating that contain simethicone such as  Maalox or Gas-X   Consider crushing her medications, especially smaller pills.  The ability to swallow pills should get easier after a few weeks  Eat in a relaxed atmosphere & minimize distractions.  Avoid talking while eating.    Do not use straws.  Following each meal, sit in an upright position (90 degree angle) for 60 to 90 minutes.  Going for a short walk can help as well  If food does stick, don't panic.  Try to relax and let the food pass on its own.    Sipping WARM LIQUID such as strong hot black tea can also help slide it down.   Be gradual in changes & use common sense:  -If you easily tolerating a certain "level" of foods, advance to the next level gradually -If you are having trouble swallowing a particular food, then avoid it.   -If food is sticking when you advance your diet, go back to thinner previous diet (the lower LEVEL) for 1-2 days.  LEVEL 1 = PUREED DIET  Do for the first 2 WEEKS AFTER SURGERY  -Foods in this group are  pureed or blenderized to a smooth, mashed potato-like consistency.  -If necessary, the pureed foods can keep their shape with the addition of a thickening agent.   -Meat should be pureed to a smooth, pasty consistency.  Hot broth or gravy may be added to the pureed meat, approximately 1 oz. of liquid per 3 oz. serving of meat. -CAUTION:  If any foods do not puree into a smooth consistency, swallowing will be more difficult.  (For example, nuts or seeds sometimes do not blend well.)  Hot Foods Cold Foods  Pureed scrambled eggs and cheese Pureed cottage cheese  Baby cereals Thickened juices and nectars  Thinned cooked cereals (no lumps) Thickened milk or eggnog  Pureed [place] toast or pancakes Ensure  Mashed potatoes Ice cream  Pureed parsley, au gratin, scalloped potatoes, candied sweet potatoes Fruit or [place] ice, sherbet  Pureed buttered or alfredo noodles Plain yogurt  Pureed vegetables (no corn or peas) Instant breakfast  Pureed soups and creamed soups Smooth pudding, mousse, custard  Pureed scalloped apples Whipped gelatin  Gravies Sugar, syrup, honey, jelly  Sauces, cheese, tomato, barbecue, white, creamed Cream  Any baby food Creamer  Alcohol in moderation (not beer or champagne) Margarine  Coffee or tea Mayonnaise   Ketchup, mustard   Apple sauce   SAMPLE MENU:  PUREED DIET Breakfast Lunch Dinner   Orange juice, 1/2 cup  Cream of wheat, 1/2 cup  Pineapple juice, 1/2 cup  Pureed [place], barley soup, 3/4 cup  Pureed Hawaiian chicken, 3 oz   Scrambled eggs, mashed or blended with cheese, 1/2 cup  Tea or coffee, 1 cup   Whole milk, 1 cup   Non-dairy creamer, 2 Tbsp.  Mashed potatoes, 1/2 cup  Pureed cooled broccoli, 1/2 cup  Apple sauce, 1/2 cup  Coffee or tea  Mashed potatoes, 1/2 cup  Pureed spinach, 1/2 cup  Frozen yogurt, 1/2 cup  Tea or coffee      LEVEL 2 = SOFT DIET  After your first 2 weeks, you can advance to a soft diet.   Keep on this  diet until everything goes down easily.  Hot Foods Cold Foods  White fish Cottage cheese  Stuffed fish Junior baby fruit  Baby food meals Semi thickened juices  Minced soft cooked, scrambled, poached eggs nectars  Souffle & omelets Ripe mashed bananas  Cooked cereals Canned fruit, pineapple sauce, milk  potatoes Milkshake  Buttered or Alfredo noodles Custard  Cooked cooled vegetable Puddings, including tapioca  Sherbet Yogurt  Vegetable soup or alphabet soup Fruit ice, [place] ice  Gravies Whipped gelatin  Sugar, syrup, honey, jelly Junior baby desserts  Sauces:  Cheese, creamed, barbecue, tomato, white Cream  Coffee or tea Margarine   SAMPLE MENU:  LEVEL 2 Breakfast Lunch Dinner   Orange juice, 1/2 cup  Oatmeal, 1/2 cup  Scrambled eggs with cheese, 1/2 cup  Decaffeinated tea, 1 cup  Whole milk, 1 cup  Non-dairy creamer, 2 Tbsp  Pineapple juice, 1/2 cup  Minced beef, 3 oz  Gravy, 2 Tbsp  Mashed potatoes, 1/2 cup  Minced fresh broccoli, 1/2 cup  Applesauce, 1/2 cup  Coffee, 1 cup  [place], barley soup, 3/4 cup  Minced Hawaiian chicken, 3 oz  Mashed potatoes, 1/2 cup  Cooked spinach, 1/2 cup  Frozen yogurt, 1/2 cup  Non-dairy creamer, 2 Tbsp      LEVEL 3 = CHOPPED DIET  -After all the foods in level 2 (soft diet) are passing through well you should advance up to more chopped foods.  -It is still important to cut these foods into small pieces and eat slowly.  Hot Foods Cold Foods  Poultry Cottage cheese  Chopped Swedish meatballs Yogurt  Meat salads (ground or flaked meat) Milk  Flaked fish (tuna) Milkshakes  Poached or scrambled eggs Soft, cold, dry cereal  Souffles and omelets Fruit juices or nectars  Cooked cereals Chopped canned fruit  Chopped French toast or pancakes Canned fruit cocktail  Noodles or pasta (no rice) Pudding, mousse, custard  Cooked vegetables (no frozen peas, corn, or mixed vegetables) Green salad  Canned small sweet peas  Ice cream  Creamed soup or vegetable soup Fruit ice, Italian ice  Pureed vegetable soup or alphabet soup Non-dairy creamer  Ground scalloped apples Margarine  Gravies Mayonnaise  Sauces:  Cheese, creamed, barbecue, tomato, white Ketchup  Coffee or tea Mustard   SAMPLE MENU:  LEVEL 3 Breakfast Lunch Dinner   Orange juice, 1/2 cup  Oatmeal, 1/2 cup  Scrambled eggs with cheese, 1/2 cup  Decaffeinated tea, 1 cup  Whole milk, 1 cup  Non-dairy creamer, 2 Tbsp  Ketchup, 1 Tbsp  Margarine, 1 tsp  Salt, 1/4 tsp  Sugar, 2 tsp  Pineapple juice, 1/2 cup  Ground beef, 3 oz  Gravy, 2 Tbsp  Mashed potatoes, 1/2 cup  Cooked spinach, 1/2 cup  Applesauce, 1/2 cup  Decaffeinated coffee  Whole milk  Non-dairy creamer, 2 Tbsp  Margarine, 1 tsp  Salt, 1/4 tsp  Pureed turkey, barley soup, 3/4 cup  Barbecue chicken, 3 oz  Mashed potatoes, 1/2 cup  Ground fresh broccoli, 1/2 cup  Frozen yogurt, 1/2 cup  Decaffeinated tea, 1 cup  Non-dairy creamer, 2 Tbsp  Margarine, 1 tsp  Salt, 1/4 tsp  Sugar, 1 tsp    LEVEL 4:  REGULAR FOODS  -Foods in this group are soft, moist, regularly textured foods.   -This level includes meat and breads, which tend to be the hardest things to swallow.   -Eat very slowly, chew well and continue to avoid carbonated drinks. -most people are at this level in 4-6 weeks  Hot Foods Cold Foods  Baked fish or skinned Soft cheeses - cottage cheese  Souffles and omelets Cream cheese  Eggs Yogurt  Stuffed shells Milk  Spaghetti with meat sauce Milkshakes  Cooked cereal Cold dry cereals (no nuts, dried fruit, coconut)  French toast or pancakes Crackers  Buttered toast Fruit juices or nectars  Noodles or pasta (no rice) Canned fruit  Potatoes (all types) Ripe bananas  Soft, cooked vegetables (no corn, lima, or baked beans) Peeled, ripe, fresh fruit  Creamed soups or vegetable soup Cakes (no nuts, dried fruit, coconut)  Canned chicken  noodle soup Plain doughnuts  Gravies Ice cream  Bacon dressing Pudding, mousse, custard  Sauces:  Cheese, creamed, barbecue, tomato, white Fruit ice, Italian ice, sherbet  Decaffeinated tea or coffee Whipped gelatin  Pork chops Regular gelatin     Canned fruited gelatin molds   Sugar, syrup, honey, jam, jelly   Cream   Non-dairy   Margarine   Oil   Mayonnaise   Ketchup   Mustard   TROUBLESHOOTING IRREGULAR BOWELS  1) Avoid extremes of bowel movements (no bad constipation/diarrhea)  2) Miralax 17gm mixed in 8oz. water or juice-daily. May use BID as needed.  3) Gas-x,Phazyme, etc. as needed for gas & bloating.  4) Soft,bland diet. No spicy,greasy,fried foods.  5) Prilosec over-the-counter as needed  6) May hold gluten/wheat products from diet to see if symptoms improve.  7) May try probiotics (Align, Activa, etc) to help calm the bowels down  7) If symptoms become worse call back immediately.    If you have any questions please call our office at [facility]: [phone].   LAPAROSCOPIC SURGERY: POST OP INSTRUCTIONS  ######################################################################  EAT Gradually transition to a high fiber diet with a fiber supplement over the next few weeks after discharge.  Start with a pureed / full liquid diet (see below)  WALK Walk an hour a day.  Control your pain to do that.    CONTROL PAIN Control pain so that you can walk, sleep, tolerate sneezing/coughing, go up/down stairs.  HAVE A BOWEL MOVEMENT DAILY Keep your bowels regular to avoid problems.  OK to try a laxative to override constipation.  OK to use an antidairrheal to slow down diarrhea.  Call if not better after 2 tries  CALL IF YOU HAVE PROBLEMS/CONCERNS Call if you are still struggling despite following these instructions. Call if you have concerns not answered by these  instructions  ######################################################################    1. DIET: Follow a light bland diet & liquids the first 24 hours after arrival home, such as soup, liquids, starches, etc.  Be sure to drink plenty of fluids.  Quickly advance to a usual solid diet within a few days.  Avoid fast food or heavy meals as your are more likely to get nauseated or have irregular bowels.  A low-fat, high-fiber diet for the rest of your life is ideal.  2. Take your usually prescribed home medications unless otherwise directed.  3. PAIN CONTROL: a. Pain is best controlled by a usual combination of three different methods TOGETHER: i. Ice/Heat ii. Over the counter pain medication iii. Prescription pain medication b. Most patients will experience some swelling and bruising around the incisions.  Ice packs or heating pads (30-60 minutes up to 6 times a day) will help. Use ice for the first few days to help decrease swelling and bruising, then switch to heat to help relax tight/sore spots and speed recovery.  Some people prefer to use ice alone, heat alone, alternating between ice & heat.  Experiment to what works for you.  Swelling and bruising can take several weeks to resolve.   c. It is helpful to take an over-the-counter pain medication regularly for the first few weeks.  Choose one of the following that works best for you: i. Naproxen (Aleve, etc)  Two 220mg tabs twice a day ii. Ibuprofen (Advil, etc) Three 200mg tabs four times a day (every meal & bedtime) iii. Acetaminophen (Tylenol, etc) 500-650mg four times a day (every meal & bedtime) d. A  prescription for pain medication (such as oxycodone, hydrocodone, tramadol, gabapentin, methocarbamol, etc) should be given to you upon discharge.  Take your pain medication as prescribed.  i. If you are having problems/concerns with the prescription medicine (does not control pain, nausea, vomiting, rash, itching, etc), please   call us [phone] to see if we need to switch you to a different pain medicine that will work better for you and/or control your side effect better. ii. If you need a refill on your pain medication, please give us 48 hour notice.  contact your pharmacy.  They will contact our office to request authorization. Prescriptions will not be filled after 5 pm or on week-ends  4. Avoid getting constipated.   a. Between the surgery and the pain medications, it is common to experience some constipation.   b. Increasing fluid intake and taking a fiber supplement (such as Metamucil, Citrucel, FiberCon, MiraLax, etc) 1-2 times a day regularly will usually help prevent this problem from occurring.   c. A mild laxative (prune juice, Milk of Magnesia, MiraLax, etc) should be taken according to package directions if there are no bowel movements after 48 hours.   5. Watch out for diarrhea.   a. If you have many loose bowel movements, simplify your diet to bland foods & liquids for a few days.   b. Stop any stool softeners and decrease your fiber supplement.   c. Switching to mild anti-diarrheal medications (Kayopectate, Pepto Bismol) can help.   d. If this worsens or does not improve, please call us.  6. Wash / shower every day.  You may shower over the dressings as they are waterproof.  Continue to shower over incision(s) after the dressing is off.  7. Remove your waterproof bandages 5 days after surgery.  You may leave the incision open to air.  You may replace a dressing/Band-Aid to cover the incision for comfort if you wish.   8. ACTIVITIES as tolerated:   a. You may resume regular (light) daily activities beginning the next day--such as daily self-care, walking, climbing stairs--gradually increasing activities as tolerated.  If you can walk 30 minutes without difficulty, it is safe to try more intense activity such as jogging, treadmill, bicycling, low-impact aerobics, swimming, etc. b. Save the most intensive and  strenuous activity for last such as sit-ups, heavy lifting, contact sports, etc  Refrain from any heavy lifting or straining until you are off narcotics for pain control.   c. DO NOT PUSH THROUGH PAIN.  Let pain be your guide: If it hurts to do something, don't do it.  Pain is your body warning you to avoid that activity for another week until the pain goes down. d. You may drive when you are no longer taking prescription pain medication, you can comfortably wear a seatbelt, and you can safely maneuver your car and apply brakes. e. You may have sexual intercourse when it is comfortable.  9. FOLLOW UP in our office a. Please call [facility] at [phone] to set up an appointment to see your surgeon in the office for a follow-up appointment approximately 2-3 weeks after your surgery. b. Make sure that you call for this appointment the day you arrive home to insure a convenient appointment time.  10. IF YOU HAVE DISABILITY OR FAMILY LEAVE FORMS, BRING THEM TO THE OFFICE FOR PROCESSING.  DO NOT GIVE THEM TO YOUR DOCTOR.   WHEN TO CALL US [phone]: 1. Poor pain control 2. Reactions / problems with new medications (rash/itching, nausea, etc)  3. Fever over 101.5 F (38.5 C) 4. Inability to urinate 5. Nausea and/or vomiting 6. Worsening swelling or bruising 7. Continued bleeding from incision. 8. Increased pain, redness, or drainage from the incision   The clinic staff is available to   answer your questions during regular business hours (8:30am-5pm).  Please don't hesitate to call and ask to speak to one of our nurses for clinical concerns.   If you have a medical emergency, go to the nearest emergency room or call 911.  A surgeon from [facility] is always on call at the hospitals   [facility] [address], [address], [place], [place]  [address] ? MAIN: [phone] ? TOLL FREE: [phone] ?  FAX [phone] [email or web address]   

## 2019-06-18 ENCOUNTER — Inpatient Hospital Stay (HOSPITAL_COMMUNITY): Payer: 59

## 2019-06-18 LAB — BASIC METABOLIC PANEL
Anion gap: 7 (ref 5–15)
BUN: 14 mg/dL (ref 8–23)
CO2: 27 mmol/L (ref 22–32)
Calcium: 8.9 mg/dL (ref 8.9–10.3)
Chloride: 107 mmol/L (ref 98–111)
Creatinine, Ser: 0.91 mg/dL (ref 0.61–1.24)
GFR calc Af Amer: >60 mL/min (ref >60)
GFR calc non Af Amer: >60 mL/min (ref >60)
Glucose, Bld: 130 mg/dL — ABNORMAL HIGH (ref 70–99)
Potassium: 4.6 mmol/L (ref 3.5–5.1)
Sodium: 141 mmol/L (ref 135–145)

## 2019-06-18 LAB — CBC
HCT: 44.4 % (ref 39.0–52.0)
Hemoglobin: 14.2 g/dL (ref 13.0–17.0)
MCH: 30.9 pg (ref 26.0–34.0)
MCHC: 32 g/dL (ref 30.0–36.0)
MCV: 96.5 fL (ref 80.0–100.0)
Platelets: 216 10*3/uL (ref 150–400)
RBC: 4.6 MIL/uL (ref 4.22–5.81)
RDW: 12.9 % (ref 11.5–15.5)
WBC: 15 10*3/uL — ABNORMAL HIGH (ref 4.0–10.5)
nRBC: 0 % (ref 0.0–0.2)

## 2019-06-18 MED ORDER — GABAPENTIN 250 MG/5ML PO SOLN
300.0000 mg | Freq: Three times a day (TID) | ORAL | Status: DC
  Administered 2019-06-18 – 2019-06-19 (×3): 300 mg via ORAL
  Filled 2019-06-18 (×5): qty 6

## 2019-06-18 MED ORDER — HYDROMORPHONE HCL 1 MG/ML IJ SOLN
0.5000 mg | INTRAMUSCULAR | Status: DC | PRN
  Administered 2019-06-18: 1 mg via INTRAVENOUS

## 2019-06-18 MED ORDER — ACETAMINOPHEN 500 MG PO TABS
1000.0000 mg | ORAL_TABLET | Freq: Four times a day (QID) | ORAL | Status: DC
  Administered 2019-06-18 – 2019-06-19 (×3): 1000 mg via ORAL
  Filled 2019-06-18 (×5): qty 2

## 2019-06-18 MED ORDER — OXYCODONE HCL 5 MG/5ML PO SOLN
5.0000 mg | ORAL | Status: DC | PRN
  Administered 2019-06-18 – 2019-06-19 (×2): 10 mg via ORAL
  Filled 2019-06-18 (×3): qty 10

## 2019-06-18 MED ORDER — IOHEXOL 300 MG/ML  SOLN
100.0000 mL | Freq: Once | INTRAMUSCULAR | Status: AC | PRN
  Administered 2019-06-18: 75 mL via ORAL

## 2019-06-18 NOTE — Plan of Care (Signed)
Patient sitting up in bed; receiving pain medication. Understands need to get out of bed and ambulate several times today. No needs expressed. Will continue to monitor.

## 2019-06-18 NOTE — Progress Notes (Signed)
Nutrition Education Note  RD consulted for nutrition education regarding patient who is s/p nissen fundoplication.  RD reviewed nissen liquid/pureed diet with patient. Per MD, pt to follow this diet for 2-3 weeks.  Reviewed clear and full liquids. Encouraged pt to avoid caffeine, carbonated beverages and use of straws.  Provided examples of appropriate foods on a soft diet. Reviewed gas producing foods.  Recommended use of a liquid multivitamin while following a liquid diet. Reviewed acceptable protein supplements.  RD discussed why it is important for patient to adhere to diet recommendations. Teach back method used.  Expect good compliance. Patient expressed understanding of diet and RD answered all questions he had.  Body mass index is 24.38 kg/m.  Pt meets criteria for normal based on current BMI.  Current diet order is full liquids. Pt states liquid diet is going "okay".  Labs and medications reviewed. No further nutrition interventions warranted at this time. If additional nutrition issues arise, please re-consult RD.   Clayton Bibles, MS, RD, LDN Inpatient Clinical Dietitian Pager: [phone] After Hours Pager: [phone]

## 2019-06-18 NOTE — Progress Notes (Signed)
Randy Larson [id number] 25-Nov-1955  CARE TEAM:  PCP: Serita Grammes, MD  Outpatient Care Team: Patient Care Team: Serita Grammes, MD as PCP - General (Family Medicine) Michael Boston, MD as Consulting Physician (General Surgery) Jackquline Denmark, MD as Consulting Physician (Gastroenterology) Park Liter, MD as Consulting Physician (Cardiology) Vickey Huger, MD as Consulting Physician (Orthopedic Surgery)  Inpatient Treatment Team: Treatment Team: Attending Provider: Michael Boston, MD; Registered Nurse: Cummings, [place] W, RN   Problem List:   Principal Problem:   Incarcerated hiatal hernia s/p roboric repair 06/17/2019 Active Problems:   Essential hypertension   Dyslipidemia   Gastroesophageal reflux disease   History of Toupet (partial posterior) fundoplication   1 Day Post-Op  06/17/2019  POST-OPERATIVE DIAGNOSIS:  INCARCERATED HIATAL HERNIA REFRACTORY TO MEDICAL MANAGEMENT  PROCEDURE:   XI ROBOTIC PRIMARY REPAIR OF PARAESOPHAGEAL HIATAL HERNIA PHASIX MESH REINFORCEMENT DIAPHRAGM RELAXING INCISION WITH MESH REPAIR TOUPET FUNDOPLICATION (Posterior A999333 degree partial fundoplication) Type II mediastinal dissection.  SURGEON:  Adin Hector, MD  OR FINDINGS:   Large paraesophageal hiatal hernia with 100% of the stomach in the mediastinum.  Most of the greater omentum and part of the mid transverse colon and tail of pancreas as well.  There was a 12 x 12 cm hiatal defect.  Relaxing incision of left diaphragm in sagittal plane 7 cm lateral and parallel to the left crus.  That gave a 8 x 4 cm diaphragmatic defect.  This allowed hiatal closure without tension.  Diaphragm defect patched with Ultrapro (ultra lightweight polypropylene) mesh 15 x 10 cm transversely, the most medial corner 3 cm lateral to the posterior hiatal hernia closure  It is a primary repair over pledgets. Mesh reinforcement of the hiatal closure was used with Mesh was used: Phasix Mesh  (a knitted monofilament mesh scaffold using Poly-4-hydroxybutyrate (P4HB), a biologically derived, fully resorbable material)  The patient has a 4 cm Toupet (partial posterior A999333 degree) fundoplication bougie.  The patient has had anterior and posterior gastropexies.   Assessment  Recovering  [facility] Stay = 1 days)  Plan:  -esophageal gastrograffin study thia AM -if no leak or obstruction, advance to pured diet.  Improved pain control.  Check labs.  PPI perioperatively.  Hypertension.  Resume lisinopril and follow.  PRN medicines  Keep drain most likely for approximately 10 days given the giant mediastinal defect and left pleural compression of this giant hiatal hernia.  Give time for the left lung to reexpand and close down.  Likely will go home with this with the nurse only visit to remove in the future  VTE prophylaxis- SCDs, etc  Mobilize as tolerated to help recovery  25 minutes spent in review, evaluation, examination, counseling, and coordination of care.  More than 50% of that time was spent in counseling.  06/18/2019    Subjective: (Chief complaint)  Patient denies any nausea.  Chest wall soreness when he tries to walk.  No fevers or chills.  Objective:  Vital signs:  Vitals:   06/17/19 1751 06/17/19 2043 06/18/19 0050 06/18/19 0522  BP: (!) 148/94 134/84 126/75 121/81  Pulse: 77 72 68 71  Resp: 18 18 18 18   Temp: 98.2 F (36.8 C) 97.8 F (36.6 C) 98.7 F (37.1 C) 98.5 F (36.9 C)  TempSrc: Oral Oral Oral Oral  SpO2: 97% 98% 97% 98%  Weight:      Height:        Last BM Date: 06/16/19  Intake/Output   Yesterday:  09/16 0701 -  09/17 0700 In: 5302.4 [P.O.:740; I.V.:3862.4; IV Piggyback:100] Out: 4570 [id number]; Drains:770; Blood:25] This shift:  No intake/output data recorded.  Bowel function:  Flatus: YES  BM:  No  Drain: Serosanguinous   Physical Exam:  General: Pt awake/alert/oriented x4 in no acute distress Eyes:  PERRL, normal EOM.  Sclera clear.  No icterus Neuro: CN II-XII intact w/o focal sensory/motor deficits. Lymph: No head/neck/groin lymphadenopathy Psych:  No delerium/psychosis/paranoia HENT: Normocephalic, Mucus membranes moist.  No thrush Neck: Supple, No tracheal deviation Chest: Mild chest soreness to deep breath.  No sharp pleuritic pain.  Decreased breath sounds on left side.  Pretty decent excursion CV:  Pulses intact.  Regular rhythm MS: Normal AROM mjr joints.  No obvious deformity  Abdomen: Soft.  Nondistended.  Mildly tender at incisions only.  No evidence of peritonitis.  No incarcerated hernias.  Ext:  No deformity.  No mjr edema.  No cyanosis Skin: No petechiae / purpura  Results:   Cultures: Recent Results (from the past 720 hour(s))  Novel Coronavirus, NAA (Hosp order, Send-out to Ref Lab; TAT 18-24 hrs     Status: None   Collection Time: 06/13/19  1:29 PM   Specimen: Nasopharyngeal Swab; Respiratory  Result Value Ref Range Status   SARS-CoV-2, NAA NOT DETECTED NOT DETECTED Final    Comment: (NOTE) This nucleic acid amplification test was developed and its performance characteristics determined by [employer]. Nucleic acid amplification tests include PCR and TMA. This test has not been FDA cleared or approved. This test has been authorized by FDA under an Emergency Use Authorization (EUA). This test is only authorized for the duration of time the declaration that circumstances exist justifying the authorization of the emergency use of in vitro diagnostic tests for detection of SARS-CoV-2 virus and/or diagnosis of COVID-19 infection under section 564(b)(1) of the Act, 21 U.S.C. [id number]) (1), unless the authorization is terminated or revoked sooner. When diagnostic testing is negative, the possibility of a false negative result should be considered in the context of a patient's recent exposures and the presence of clinical signs and symptoms consistent  with COVID-19. An individual without symptoms of COVID- 19 and who is not shedding SARS-CoV-2 vi rus would expect to have a negative (not detected) result in this assay. Performed At: [facility] [address] [place], [place] [id number] Rush Farmer MD [id number]    [place]  Final    Comment: Performed at [facility], [place] [address], [place], [place] [address]    Labs: Results for orders placed or performed during the hospital encounter of 06/17/19 (from the past 48 hour(s))  Type and screen [facility]     Status: None   Collection Time: 06/17/19  9:00 AM  Result Value Ref Range   ABO/RH(D) O POS    Antibody Screen NEG    Sample Expiration      06/20/2019,2359 Performed at [facility], [place] [address] [place], [place] [address]   ABO/Rh     Status: None   Collection Time: 06/17/19  9:00 AM  Result Value Ref Range   ABO/RH(D)      O POS Performed at [facility], [place] [address], [place], [place] [address]     Imaging / Studies: No results found.  Medications / Allergies: per chart  Antibiotics: Anti-infectives (From admission, onward)   Start     Dose/Rate Route Frequency Ordered Stop   06/17/19 1600  ceFAZolin (ANCEF) IVPB 2g/100  mL premix     2 g 200 mL/hr over 30 Minutes Intravenous Every 8 hours 06/17/19 1515 06/17/19 1614   06/17/19 0630  cefTRIAXone (ROCEPHIN) 2 g in sodium chloride 0.9 % 100 mL IVPB     2 g 200 mL/hr over 30 Minutes Intravenous On call to O.R. 06/17/19 [id number] 06/17/19 0919   06/17/19 0630  metroNIDAZOLE (FLAGYL) IVPB 500 mg     500 mg 100 mL/hr over 60 Minutes Intravenous On call to O.R. 06/17/19 [id number] 06/17/19 [id number]        Note: Portions of this report may have been transcribed using voice recognition software. Every effort was made to ensure accuracy; however, inadvertent computerized transcription errors may be present.   Any  transcriptional errors that result from this process are unintentional.     Adin Hector, MD, FACS, MASCRS Gastrointestinal and Minimally Invasive Surgery    [address] [place], [place] [address] [phone] Main / Paging [phone] Fax

## 2019-06-19 NOTE — Progress Notes (Signed)
Discharge instructions given to patient. Patient had no questions. NT or writer will wheel patient out once family comes in  

## 2019-06-19 NOTE — Discharge Summary (Signed)
Physician Discharge Summary    Patient ID: Randy Larson MRN: [id number] DOB/AGE: [birth year]  63 y.o.  Patient Care Team: Buckner Malta, MD as PCP - General (Family Medicine) Karie Soda, MD as Consulting Physician (General Surgery) Lynann Bologna, MD as Consulting Physician (Gastroenterology) Georgeanna Lea, MD as Consulting Physician (Cardiology) Dannielle Huh, MD as Consulting Physician (Orthopedic Surgery)  Admit date: 06/17/2019  Discharge date: 06/19/2019  Hospital Stay = 2 days    Discharge Diagnoses:  Principal Problem:   Incarcerated hiatal hernia s/p roboric repair 06/17/2019 Active Problems:   Essential hypertension   Dyslipidemia   Gastroesophageal reflux disease   History of Toupet (partial posterior) fundoplication   2 Days Post-Op  06/17/2019  POST-OPERATIVE DIAGNOSIS:INCARCERATED HIATAL HERNIA REFRACTORY TO MEDICAL MANAGEMENT  PROCEDURE:  XI ROBOTICPRIMARYREPAIR OF PARAESOPHAGEAL HIATAL HERNIA PHASIX MESH REINFORCEMENT DIAPHRAGM RELAXING INCISION WITH MESH REPAIR TOUPET FUNDOPLICATION(Posterior 240 degree partial fundoplication) Type II mediastinal dissection.  SURGEON: Ardeth Sportsman, MD  OR FINDINGS: Largeparaesophageal hiatal hernia with100% of the stomach in the mediastinum.Most of the greater omentum and part of the mid transverse colon and tail of pancreas as well.There was a12x 12cm hiatal defect.  Relaxing incision of left diaphragm in sagittal plane 7 cm lateral and parallel to the left crus. That gave a 8 x 4 cm diaphragmatic defect. This allowed hiatal closure without tension. Diaphragm defect patched with Ultrapro (ultra lightweight polypropylene) mesh 15 x 10 cm transversely,the most medial corner 3 cm lateral to the posterior hiatal hernia closure  It is a primary repair over pledgets. Mesh reinforcementof the hiatal closurewas used withMesh was used: PhasixTMesh (a knitted monofilament mesh  scaffold using Poly-4-hydroxybutyrate (P4HB), a biologically derived, fully resorbable material)  The patient has a4cm Toupet (partial posterior 240 degree)fundoplication bougie. The patient has had anterior and posterior gastropexies.   Consults: nutrition  Hospital Course:   The patient underwent the surgery above.  Postoperatively, the patient gradually mobilized and advanced to a solid diet.  Pain and other symptoms were treated aggressively.  Esophagram showed no leak/obstruction    By the time of discharge, the patient was walking well the hallways, eating pureed food, having flatus.  Pain was well-controlled on an oral medications.  Based on meeting discharge criteria and continuing to recover, I felt it was safe for the patient to be discharged from the hospital to further recover with close followup. Postoperative recommendations were discussed in detail.  They are written as well.  Discharged Condition: good  Discharge Exam: Blood pressure 123/85, pulse 69, temperature 98.8 F (37.1 C), temperature source Oral, resp. rate 18, height 5\' 11"  (1.803 m), weight 79.3 kg, SpO2 95 %.  General: Pt awake/alert/oriented x4 in No acute distress Eyes: PERRL, normal EOM.  Sclera clear.  No icterus Neuro: CN II-XII intact w/o focal sensory/motor deficits. Lymph: No head/neck/groin lymphadenopathy Psych:  No delerium/psychosis/paranoia HENT: Normocephalic, Mucus membranes moist.  No thrush Neck: Supple, No tracheal deviation Chest: No chest wall pain w good excursion CV:  Pulses intact.  Regular rhythm MS: Normal AROM mjr joints.  No obvious deformity Abdomen: Soft.  Nondistended.  Mildly tender at incisions only.  No evidence of peritonitis.  No incarcerated hernias. Ext:  SCDs BLE.  No mjr edema.  No cyanosis Skin: No petechiae / purpura   Disposition:   Follow-up Information    Karie Soda, MD. Schedule an appointment as soon as possible for a visit in 3 weeks.     Specialty: General Surgery Why: To follow up after  your operation, To follow up after your hospital stay Contact information: [address] [place] [address] [phone]           Discharge disposition: 01-Home or Self Care       Discharge Instructions    Call MD for:   Complete by: As directed    Temperature > 101.15F   Call MD for:  extreme fatigue   Complete by: As directed    Call MD for:  hives   Complete by: As directed    Call MD for:  persistant nausea and vomiting   Complete by: As directed    Call MD for:  redness, tenderness, or signs of infection (pain, swelling, redness, odor or green/yellow discharge around incision site)   Complete by: As directed    Call MD for:  severe uncontrolled pain   Complete by: As directed    Diet general   Complete by: As directed    SEE ESOPHAGEAL SURGERY DIET INSTRUCTIONS  We using usually start you out on a pureed (blenderized) diet. Expect some sticking with swallowing over the next 1-2 months.   This is due to swelling around your esophagus at the wrap & hiatal diaphragm repair.  It will gradually ease off over the next few months.   Discharge instructions   Complete by: As directed    Please see discharge instruction sheets.   Also refer to any handouts/printouts that may have been given from the [facility] surgery office (if you visited [place] there before surgery) Please call our office if you have any questions or concerns [phone]   Driving Restrictions   Complete by: As directed    No driving until off narcotics and can safely swerve away without pain during an emergency   Increase activity slowly   Complete by: As directed    Lifting restrictions   Complete by: As directed    Avoid heavy lifting initially, <20 pounds at first.   Do not push through pain.   You have no specific weight limit: If it hurts to do, DON'T DO IT.    If you feel no pain, you are not injuring anything.  Pain will protect  you from injury.   Coughing and sneezing are far more stressful to your incision than any lifting.   Avoid resuming heavy lifting (>50 pounds) or other intense activity until off all narcotic pain medications.   When want to exercise more, give yourself 2 weeks to gradually get back to full intense exercise/activity.   May shower / Bathe   Complete by: As directed    SHOWER EVERY DAY.  It is fine for dressings or wounds to be washed/rinsed.  Use gentle soap & water.  This will help the incisions and/or wounds get clean & minimize infection.   May walk up steps   Complete by: As directed    Remove dressing in 72 hours   Complete by: As directed    You have closed incisions: Shower and bathe over these incisions with soap and water every day.  It is OK to wash over the dressings: they are waterproof. Remove all surgical dressings on postoperative day #3.  You do not need to replace dressings over the closed incisions unless you feel more comfortable with a Band-Aid covering it.   Please call our office [phone] if you have further questions.   Sexual Activity Restrictions   Complete by: As directed    Sexual activity as tolerated.  Do not push through pain.  Pain will protect you from injury.   Walk with assistance   Complete by: As directed    Walk over an hour a day.  May use a walker/cane/companion to help with balance and stamina.      Allergies as of 06/19/2019   No Known Allergies     Medication List    TAKE these medications   HYDROcodone-acetaminophen 5-325 MG tablet Commonly known as: NORCO/VICODIN Take 1-2 tablets by mouth every 6 (six) hours as needed for moderate pain or severe pain.   lisinopril 10 MG tablet Commonly known as: ZESTRIL Take 10 mg by mouth daily.   ondansetron 4 MG tablet Commonly known as: ZOFRAN Take 1 tablet (4 mg total) by mouth every 8 (eight) hours as needed for nausea or vomiting.   pantoprazole 40 MG tablet Commonly known as:  PROTONIX Take 40 mg by mouth daily.   promethazine 25 MG suppository Commonly known as: PHENERGAN Place 1 suppository (25 mg total) rectally every 6 (six) hours as needed for refractory nausea / vomiting.       Significant Diagnostic Studies:  Results for orders placed or performed during the hospital encounter of 06/17/19 (from the past 72 hour(s))  Type and screen [facility]     Status: None   Collection Time: 06/17/19  9:00 AM  Result Value Ref Range   ABO/RH(D) O POS    Antibody Screen NEG    Sample Expiration      06/20/2019,2359 Performed at [facility], [address] [place], [place] [address]   ABO/Rh     Status: None   Collection Time: 06/17/19  9:00 AM  Result Value Ref Range   ABO/RH(D)      O POS Performed at [facility], [address] [place], [place] [address]   Basic metabolic panel     Status: Abnormal   Collection Time: 06/18/19  7:42 AM  Result Value Ref Range   Sodium 141 135 - 145 mmol/L   Potassium 4.6 3.5 - 5.1 mmol/L   Chloride 107 98 - 111 mmol/L   CO2 27 22 - 32 mmol/L   Glucose, Bld 130 (H) 70 - 99 mg/dL   BUN 14 8 - 23 mg/dL   Creatinine, Ser 0.98 0.61 - 1.24 mg/dL   Calcium 8.9 8.9 - 11.9 mg/dL   GFR calc non Af Amer >60 >60 mL/min   GFR calc Af Amer >60 >60 mL/min   Anion gap 7 5 - 15    Comment: Performed at [facility], [address], [place], [place] [address]  CBC     Status: Abnormal   Collection Time: 06/18/19  7:42 AM  Result Value Ref Range   WBC 15.0 (H) 4.0 - 10.5 K/uL   RBC 4.60 4.22 - 5.81 MIL/uL   Hemoglobin 14.2 13.0 - 17.0 g/dL   HCT 95.6 21.3 - 08.6 %   MCV 96.5 80.0 - 100.0 fL   MCH 30.9 26.0 - 34.0 pg   MCHC 32.0 30.0 - 36.0 g/dL   RDW 57.8 46.9 - 62.9 %   Platelets 216 150 - 400 K/uL   nRBC 0.0 0.0 - 0.2 %    Comment: Performed at [facility], [address], [place], [place] [address]    No results  found.  Past Medical History:  Diagnosis Date  . Atypical chest pain 11/09/2015  . Bilateral chronic knee pain 01/24/2017  .  Contusion of left elbow 07/24/2018  . Dark stools 12/02/2018  . Dyslipidemia 11/09/2015  . Essential hypertension 11/09/2015  . GERD (gastroesophageal reflux disease)   . Hemorrhoids   . History of colonic polyps 12/02/2018  . History of pneumothorax   . Incarcerated hiatal hernia containing stomach, colon, and pancreas 12/02/2018    Past Surgical History:  Procedure Laterality Date  . COLONOSCOPY  02/04/2009   Colonic polyps. Mild sigmoid diverticulosis. Small internal hemorrhoids.   . ESOPHAGEAL MANOMETRY N/A 04/13/2019   Procedure: ESOPHAGEAL MANOMETRY (EM);  Surgeon: Napoleon Form, MD;  Location: [facility]OSCOPY;  Service: Endoscopy;  Laterality: N/A;    Social History   Socioeconomic History  . Marital status: Married    Spouse name: Not on file  . Number of children: Not on file  . Years of education: Not on file  . Highest education level: Not on file  Occupational History  . Not on file  Social Needs  . Financial resource strain: Not on file  . Food insecurity    Worry: Not on file    Inability: Not on file  . Transportation needs    Medical: Not on file    Non-medical: Not on file  Tobacco Use  . Smoking status: Former Smoker    Types: Cigarettes  . Smokeless tobacco: Never Used  Substance and Sexual Activity  . Alcohol use: Yes    Comment: Rare/special occasion  . Drug use: Not Currently  . Sexual activity: Not on file  Lifestyle  . Physical activity    Days per week: Not on file    Minutes per session: Not on file  . Stress: Not on file  Relationships  . Social Musician on phone: Not on file    Gets together: Not on file    Attends religious service: Not on file    Active member of club or organization: Not on file    Attends meetings of clubs or organizations: Not on file    Relationship status: Not on file  .  Intimate partner violence    Fear of current or ex partner: Not on file    Emotionally abused: Not on file    Physically abused: Not on file    Forced sexual activity: Not on file  Other Topics Concern  . Not on file  Social History Narrative  . Not on file    Family History  Problem Relation Age of Onset  . Diabetes Mother   . Hypertension Mother   . Heart disease Mother   . Congestive Heart Failure Mother   . Congestive Heart Failure Father     Current Facility-Administered Medications  Medication Dose Route Frequency Provider Last Rate Last Dose  . 0.9 %  sodium chloride infusion (Manually program via Guardrails IV Fluids)   Intravenous Once Karie Soda, MD      . 0.9 %  sodium chloride infusion  250 mL Intravenous PRN Karie Soda, MD      . acetaminophen (TYLENOL) tablet 1,000 mg  1,000 mg Oral Trecia Rogers, MD   1,000 mg at 06/19/19 0607  . albumin human 5 % solution 12.5 g  12.5 g Intravenous Q6H PRN Karie Soda, MD      . alum & mag hydroxide-simeth (MAALOX/MYLANTA) 200-200-20 MG/5ML suspension 30 mL  30 mL Oral Q6H PRN Karie Soda, MD      . bisacodyl (DULCOLAX) suppository 10 mg  10 mg Rectal Daily PRN Karie Soda, MD      .  diphenhydrAMINE (BENADRYL) 12.5 MG/5ML elixir 12.5 mg  12.5 mg Oral Q6H PRN Karie Soda, MD       Or  . diphenhydrAMINE (BENADRYL) injection 12.5 mg  12.5 mg Intravenous Q6H PRN Karie Soda, MD      . enoxaparin (LOVENOX) injection 40 mg  40 mg Subcutaneous Q24H Karie Soda, MD   40 mg at 06/18/19 0824  . feeding supplement (ENSURE SURGERY) liquid 237 mL  237 mL Oral BID BM Karie Soda, MD   237 mL at 06/18/19 1434  . gabapentin (NEURONTIN) 250 MG/5ML solution 300 mg  300 mg Oral Trixie Deis, MD   300 mg at 06/19/19 0608  . guaiFENesin-dextromethorphan (ROBITUSSIN DM) 100-10 MG/5ML syrup 10 mL  10 mL Oral Q4H PRN Karie Soda, MD      . hydrALAZINE (APRESOLINE) injection 5-20 mg  5-20 mg Intravenous Q4H PRN Karie Soda,  MD      . hydrocortisone (ANUSOL-HC) 2.5 % rectal cream 1 application  1 application Topical QID PRN Karie Soda, MD      . hydrocortisone cream 1 % 1 application  1 application Topical TID PRN Karie Soda, MD      . HYDROmorphone (DILAUDID) injection 0.5-2 mg  0.5-2 mg Intravenous Q2H PRN Karie Soda, MD   1 mg at 06/18/19 0739  . lip balm (CARMEX) ointment 1 application  1 application Topical BID Karie Soda, MD   1 application at 06/18/19 2056  . lisinopril (ZESTRIL) tablet 10 mg  10 mg Oral Daily Karie Soda, MD   10 mg at 06/18/19 1044  . magic mouthwash  15 mL Oral QID PRN Karie Soda, MD      . menthol-cetylpyridinium (CEPACOL) lozenge 3 mg  1 lozenge Oral PRN Karie Soda, MD      . methocarbamol (ROBAXIN) tablet 750 mg  750 mg Oral Q6H PRN Karie Soda, MD      . metoCLOPramide (REGLAN) injection 5-10 mg  5-10 mg Intravenous Q8H PRN Karie Soda, MD      . metoprolol tartrate (LOPRESSOR) injection 5 mg  5 mg Intravenous Q6H PRN Karie Soda, MD      . ondansetron (ZOFRAN-ODT) disintegrating tablet 4 mg  4 mg Oral Q6H PRN Karie Soda, MD       Or  . ondansetron (ZOFRAN) injection 4 mg  4 mg Intravenous Q6H PRN Karie Soda, MD      . oxyCODONE (ROXICODONE) 5 MG/5ML solution 5-10 mg  5-10 mg Oral Q4H PRN Karie Soda, MD   10 mg at 06/18/19 1813  . pantoprazole sodium (PROTONIX) 40 mg/20 mL oral suspension 40 mg  40 mg Oral Daily Danford Bad, RPH   40 mg at 06/18/19 1044  . phenol (CHLORASEPTIC) mouth spray 1-2 spray  1-2 spray Mouth/Throat PRN Karie Soda, MD      . polyethylene glycol (MIRALAX / GLYCOLAX) packet 17 g  17 g Oral Daily PRN Karie Soda, MD      . prochlorperazine (COMPAZINE) tablet 10 mg  10 mg Oral Q6H PRN Karie Soda, MD       Or  . prochlorperazine (COMPAZINE) injection 5-10 mg  5-10 mg Intravenous Q6H PRN Karie Soda, MD      . simethicone (MYLICON) chewable tablet 40 mg  40 mg Oral Q6H PRN Karie Soda, MD      . sodium chloride flush  (NS) 0.9 % injection 3 mL  3 mL Intravenous Catha Gosselin, MD   3 mL at 06/18/19 2056  .  sodium chloride flush (NS) 0.9 % injection 3 mL  3 mL Intravenous PRN Karie Soda, MD         No Known Allergies  Signed: Lorenso Courier, MD, FACS, MASCRS Gastrointestinal and Minimally Invasive Surgery    [address] [place], [place] [address] [phone] Main / Paging [phone] Fax   06/19/2019, 7:37 AM

## 2019-06-19 NOTE — OR Nursing (Signed)
Added model number to 3x6 mesh URM3  Deiona Hooper, RN3, BSN

## 2019-06-19 NOTE — TOC Transition Note (Signed)
Transition of Care [facility] - CM/SW Discharge Note   Patient Details  Name: Randy Larson MRN: [id number] Date of Birth: 03/20/1956  Transition of Care [facility] CM/SW Contact:  Leeroy Cha, RN Phone Number: 06/19/2019, 10:21 AM   Clinical Narrative:    Orders checked for dc needs none found. dcd to home with self care   Final next level of care: Home/Self Care Barriers to Discharge: No Barriers Identified   Patient Goals and CMS Choice        Discharge Placement                       Discharge Plan and Services   Discharge Planning Services: CM Consult                                 Social Determinants of Health (SDOH) Interventions     Readmission Risk Interventions No flowsheet data found.

## 2019-08-25 ENCOUNTER — Ambulatory Visit: Payer: 59 | Admitting: Cardiology

## 2019-10-19 ENCOUNTER — Encounter: Payer: Self-pay | Admitting: Gastroenterology

## 2019-11-02 ENCOUNTER — Encounter: Payer: Self-pay | Admitting: Gastroenterology

## 2019-11-02 ENCOUNTER — Telehealth (INDEPENDENT_AMBULATORY_CARE_PROVIDER_SITE_OTHER): Payer: BC Managed Care – PPO | Admitting: Gastroenterology

## 2019-11-02 ENCOUNTER — Other Ambulatory Visit: Payer: Self-pay

## 2019-11-02 VITALS — Ht 71.0 in | Wt 170.0 lb

## 2019-11-02 DIAGNOSIS — Z86010 Personal history of colonic polyps: Secondary | ICD-10-CM

## 2019-11-02 DIAGNOSIS — Z01818 Encounter for other preprocedural examination: Secondary | ICD-10-CM

## 2019-11-02 DIAGNOSIS — Z800 Family history of malignant neoplasm of digestive organs: Secondary | ICD-10-CM

## 2019-11-02 NOTE — Progress Notes (Signed)
Chief Complaint:   Referring Provider:  Serita Grammes, MD      ASSESSMENT AND PLAN;   #1. H/O colonic polyps  #2. Large paraesophageal HH s/p robotic repair with Toupet fundoplication (Dr Johney Maine) [id number]. Has H/O short segment Barretts.  #3. FH colon cancer (brother at age 64)  Plan: -He would like to proceed with colonoscopy.  He understands the risks and benefits. -He would like to hold off on EGD as it will not be "routine/preventative".  He understands the risks and benefits especially with previous history of short segment Barrett's esophagus.  He would be willing to get it done next year when he retires.   HPI:    Randy Larson is a 64 y.o. male   Was scheduled for in person visit but changed to televisit d/t transportation/weather.  No nausea, vomiting, heartburn, regurgitation, odynophagia or dysphagia.  No significant diarrhea or constipation.  No melena or hematochezia. No unintentional weight loss. No abdominal pain.  S/p robotic repair of large paraesophageal hiatal hernia (100% of the stomach was in the chest), also had Toupet fundoplication.  Doing well.  Off PPIs per Dr. Johney Maine.  Past GI procedures: -Colonoscopy 01/2009 ([facility]): 1.5 cm pedunculated polyp in mid ascending colon s/p polypectomy.  Mild sigmoid diverticulosis. Bx- TV adenoma.  Advised to repeat in 3 years.  Got letters but did not decide to get it done until now. -EGD 02/04/2009 short segment Barrett's esophagus, paraesophageal hernia, mild gastritis, minimal flattening of duodenal mucosa but negative biopsies for celiac.  Got letters to get EGD repeated in 3 years but did not -Esophageal manometry preoperative- neg  Wants to hold off on EGD as it would not be "routine". History of noncompliance. Past Medical History:  Diagnosis Date  . Atypical chest pain 11/09/2015  . Bilateral chronic knee pain 01/24/2017  . Contusion of left elbow 07/24/2018  . Dark stools 12/02/2018  . Dyslipidemia 11/09/2015    . Essential hypertension 11/09/2015  . GERD (gastroesophageal reflux disease)   . Hemorrhoids   . History of colonic polyps 12/02/2018  . History of pneumothorax   . Incarcerated hiatal hernia containing stomach, colon, and pancreas 12/02/2018    Past Surgical History:  Procedure Laterality Date  . COLONOSCOPY  02/04/2009   Colonic polyps. Mild sigmoid diverticulosis. Small internal hemorrhoids.   . ESOPHAGEAL MANOMETRY N/A 04/13/2019   Procedure: ESOPHAGEAL MANOMETRY (EM);  Surgeon: Mauri Pole, MD;  Location: [facility];  Service: Endoscopy;  Laterality: N/A;  . ESOPHAGOGASTRODUODENOSCOPY  02/04/2009   Short segment Barretts Esphagus. Paraesophageal hernia. Mild gastritis. Minimal flattening of the duodenal mucosa of questionable importance, biopsied.     Family History  Problem Relation Age of Onset  . Diabetes Mother   . Hypertension Mother   . Heart disease Mother   . Congestive Heart Failure Mother   . Congestive Heart Failure Father   . Cancer Brother   . Esophageal cancer Neg Hx     Social History   Tobacco Use  . Smoking status: Former Smoker    Types: Cigarettes  . Smokeless tobacco: Never Used  Substance Use Topics  . Alcohol use: Yes    Comment: Rare/special occasion  . Drug use: Not Currently    Current Outpatient Medications  Medication Sig Dispense Refill  . escitalopram (LEXAPRO) 20 MG tablet Take 20 mg by mouth at bedtime.    Marland Kitchen lisinopril (ZESTRIL) 10 MG tablet Take 10 mg by mouth daily.      No current  facility-administered medications for this visit.    No Known Allergies  Review of Systems:  Constitutional: Denies fever, chills, diaphoresis, appetite change and fatigue.  HEENT: Denies photophobia, eye pain, redness, hearing loss, ear pain, congestion, sore throat, rhinorrhea, sneezing, mouth sores, neck pain, neck stiffness and tinnitus.   Respiratory: Denies SOB, DOE, cough, chest tightness,  and wheezing.   Cardiovascular: Denies chest  pain, palpitations and leg swelling.  Genitourinary: Denies dysuria, urgency, frequency, hematuria, flank pain and difficulty urinating.  Musculoskeletal: Denies myalgias, back pain, joint swelling, arthralgias and gait problem.  Skin: No rash.  Neurological: Denies dizziness, seizures, syncope, weakness, light-headedness, numbness and headaches.  Hematological: Denies adenopathy. Easy bruising, personal or family bleeding history  Psychiatric/Behavioral: No anxiety or depression     Physical Exam:    Ht 5\' 11"  (1.803 m)   Wt 170 lb (77.1 kg)   BMI 23.71 kg/m  Wt Readings from Last 3 Encounters:  11/02/19 170 lb (77.1 kg)  06/17/19 174 lb 12.8 oz (79.3 kg)  06/10/19 174 lb 12.8 oz (79.3 kg)   Not examined since it was a televisit  Data Reviewed: I have personally reviewed following labs and imaging studies  CBC: CBC Latest Ref Rng & Units 06/18/2019 06/10/2019  WBC 4.0 - 10.5 K/uL 15.0(H) 6.2  Hemoglobin 13.0 - 17.0 g/dL 14.2 16.1  Hematocrit 39.0 - 52.0 % 44.4 50.2  Platelets 150 - 400 K/uL 216 248    CMP: CMP Latest Ref Rng & Units 06/18/2019 06/10/2019  Glucose 70 - 99 mg/dL 130(H) 103(H)  BUN 8 - 23 mg/dL 14 22  Creatinine 0.61 - 1.24 mg/dL 0.91 0.94  Sodium 135 - 145 mmol/L 141 138  Potassium 3.5 - 5.1 mmol/L 4.6 4.7  Chloride 98 - 111 mmol/L 107 106  CO2 22 - 32 mmol/L 27 26  Calcium 8.9 - 10.3 mg/dL 8.9 9.5   I connected with  [name] on 11/02/19 by a phone telemedicine (video not utilized due to unstable Internet) application and verified that I am speaking with the correct person using two identifiers.   I discussed the limitations of evaluation and management by telemedicine. The patient expressed understanding and agreed to proceed.     Carmell Austria, MD 11/02/2019, 9:44 AM  Cc: Serita Grammes, MD

## 2019-11-02 NOTE — Patient Instructions (Addendum)
If you are age 64 or older, your body mass index should be between 23-30. Your Body mass index is 23.71 kg/m. If this is out of the aforementioned range listed, please consider follow up with your Primary Care Provider.  If you are age 72 or younger, your body mass index should be between 19-25. Your Body mass index is 23.71 kg/m. If this is out of the aformentioned range listed, please consider follow up with your Primary Care Provider.   You have been scheduled for a colonoscopy. Please follow written instructions given to you at your visit today.  Please pick up your prep supplies at the pharmacy within the next 1-3 days. If you use inhalers (even only as needed), please bring them with you on the day of your procedure. Your physician has requested that you go to www.startemmi.com and enter the access code given to you at your visit today. This web site gives a general overview about your procedure. However, you should still follow specific instructions given to you by our office regarding your preparation for the procedure.  I have attached a Pre Procedure Patient Acknowledgement form, please initial and sign form and bring to your appointment.    Thank you,  Dr. Jackquline Denmark

## 2019-11-03 ENCOUNTER — Encounter: Payer: Self-pay | Admitting: Gastroenterology

## 2019-11-12 ENCOUNTER — Other Ambulatory Visit: Payer: Self-pay | Admitting: Gastroenterology

## 2019-11-12 ENCOUNTER — Ambulatory Visit (INDEPENDENT_AMBULATORY_CARE_PROVIDER_SITE_OTHER): Payer: BC Managed Care – PPO

## 2019-11-12 DIAGNOSIS — Z1159 Encounter for screening for other viral diseases: Secondary | ICD-10-CM

## 2019-11-12 LAB — SARS CORONAVIRUS 2 (TAT 6-24 HRS): SARS Coronavirus 2: NEGATIVE

## 2019-11-16 ENCOUNTER — Ambulatory Visit (AMBULATORY_SURGERY_CENTER): Payer: BC Managed Care – PPO | Admitting: Gastroenterology

## 2019-11-16 ENCOUNTER — Other Ambulatory Visit: Payer: Self-pay

## 2019-11-16 ENCOUNTER — Encounter: Payer: Self-pay | Admitting: Gastroenterology

## 2019-11-16 VITALS — BP 122/77 | HR 59 | Temp 96.2°F | Resp 12 | Ht 71.0 in | Wt 177.0 lb

## 2019-11-16 DIAGNOSIS — Z800 Family history of malignant neoplasm of digestive organs: Secondary | ICD-10-CM

## 2019-11-16 DIAGNOSIS — K635 Polyp of colon: Secondary | ICD-10-CM | POA: Diagnosis not present

## 2019-11-16 DIAGNOSIS — D122 Benign neoplasm of ascending colon: Secondary | ICD-10-CM | POA: Diagnosis not present

## 2019-11-16 DIAGNOSIS — Z1211 Encounter for screening for malignant neoplasm of colon: Secondary | ICD-10-CM | POA: Diagnosis not present

## 2019-11-16 DIAGNOSIS — D125 Benign neoplasm of sigmoid colon: Secondary | ICD-10-CM

## 2019-11-16 DIAGNOSIS — Z86010 Personal history of colonic polyps: Secondary | ICD-10-CM

## 2019-11-16 MED ORDER — SODIUM CHLORIDE 0.9 % IV SOLN: 500.0000 mL | Freq: Once | INTRAVENOUS | Status: DC

## 2019-11-16 NOTE — Op Note (Signed)
[place] Patient Name: Randy Larson Procedure Date: 11/16/2019 1:13 PM MRN: [id number] Endoscopist: Jackquline Denmark , MD Age: 64 Referring MD:  Date of Birth: 05/27/1956 Gender: Male Account #: [id number] Procedure:                Colonoscopy Indications:              Screening in patient at increased risk: Colorectal                            cancer in brother 73 or older, High risk colon                            cancer surveillance: Personal history of colonic                            polyps Medicines:                Monitored Anesthesia Care Procedure:                Pre-Anesthesia Assessment:                           - Prior to the procedure, a History and Physical                            was performed, and patient medications and                            allergies were reviewed. The patient's tolerance of                            previous anesthesia was also reviewed. The risks                            and benefits of the procedure and the sedation                            options and risks were discussed with the patient.                            All questions were answered, and informed consent                            was obtained. Prior Anticoagulants: The patient has                            taken no previous anticoagulant or antiplatelet                            agents. ASA Grade Assessment: II - A patient with                            mild systemic disease. After reviewing the risks  and benefits, the patient was deemed in                            satisfactory condition to undergo the procedure.                           After obtaining informed consent, the colonoscope                            was passed under direct vision. Throughout the                            procedure, the patient's blood pressure, pulse, and                            oxygen saturations were monitored continuously. The                    Colonoscope was introduced through the anus and                            advanced to the 2 cm into the ileum. The                            colonoscopy was performed without difficulty. The                            patient tolerated the procedure well. The quality                            of the bowel preparation was good. The terminal                            ileum, ileocecal valve, appendiceal orifice, and                            rectum were photographed. Scope In: 1:30:25 PM Scope Out: 1:45:43 PM Scope Withdrawal Time: 0 hours 11 minutes 53 seconds  Total Procedure Duration: 0 hours 15 minutes 18 seconds  Findings:                 A 4 mm polyp was found in the proximal ascending                            colon. The polyp was sessile. The polyp was removed                            with a cold snare. Resection and retrieval were                            complete. Estimated blood loss: none.                           A 8 mm polyp was found in the distal sigmoid colon.  The polyp was sessile. The polyp was removed with a                            hot snare. Resection and retrieval were complete.                           A few small-mouthed diverticula were found in the                            sigmoid colon, rare in ascending colon and single                            small in cecum.                           Non-bleeding internal hemorrhoids were found during                            retroflexion. The hemorrhoids were small.                           The terminal ileum appeared normal.                           The exam was otherwise without abnormality on                            direct and retroflexion views. Complications:            No immediate complications. Estimated Blood Loss:     Estimated blood loss: none. Impression:               -Colonic polyps s/p polypectomy.                           -Mild pancolonic  diverticulosis.                           -Otherwise normal colonoscopy to TI. Recommendation:           - Patient has a contact number available for                            emergencies. The signs and symptoms of potential                            delayed complications were discussed with the                            patient. Return to normal activities tomorrow.                            Written discharge instructions were provided to the                            patient.                           -  Resume previous diet.                           - Continue present medications.                           - Await pathology results.                           - No aspirin, ibuprofen, naproxen, or other                            non-steroidal anti-inflammatory drugs for 5 days                            after polyp removal.                           - Repeat colonoscopy for surveillance based on                            pathology results.                           - Return to GI clinic PRN.                           - The findings and recommendations were discussed                            with the patient's wife Hallie. Jackquline Denmark, MD 11/16/2019 1:51:52 PM This report has been signed electronically.

## 2019-11-16 NOTE — Patient Instructions (Signed)
HANDOUTS PROVIDED ON: POLYPS, DIVERTICULOSIS, & HEMORRHOIDS  The polyps removed today have been sent for pathology.  The results can take 1-3 weeks to receive.  When your next colonoscopy should occur will be based on the pathology results.    You may resume your previous diet and medication schedule.  NO ASPIRIN, GOODY'S OR BC POWDERS, IBUPROFEN, ALEVE, OR ANY OTHER NSAIDs FOR 5 DAYS.  Thank you for allowing [place] to care for you today!!!   YOU HAD AN ENDOSCOPIC PROCEDURE TODAY AT [place]:   Refer to the procedure report that was given to you for any specific questions about what was found during the examination.  If the procedure report does not answer your questions, please call your gastroenterologist to clarify.  If you requested that your care partner not be given the details of your procedure findings, then the procedure report has been included in a sealed envelope for you to review at your convenience later.  YOU SHOULD EXPECT: Some feelings of bloating in the abdomen. Passage of more gas than usual.  Walking can help get rid of the air that was put into your GI tract during the procedure and reduce the bloating. If you had a lower endoscopy (such as a colonoscopy or flexible sigmoidoscopy) you may notice spotting of blood in your stool or on the toilet paper. If you underwent a bowel prep for your procedure, you may not have a normal bowel movement for a few days.  Please Note:  You might notice some irritation and congestion in your nose or some drainage.  This is from the oxygen used during your procedure.  There is no need for concern and it should clear up in a day or so.  SYMPTOMS TO REPORT IMMEDIATELY:   Following lower endoscopy (colonoscopy or flexible sigmoidoscopy):  Excessive amounts of blood in the stool  Significant tenderness or worsening of abdominal pains  Swelling of the abdomen that is new, acute  Fever of 100F or higher  For urgent or emergent  issues, a gastroenterologist can be reached at any hour by calling [phone].   DIET:  We do recommend a small meal at first, but then you may proceed to your regular diet.  Drink plenty of fluids but you should avoid alcoholic beverages for 24 hours.  ACTIVITY:  You should plan to take it easy for the rest of today and you should NOT DRIVE or use heavy machinery until tomorrow (because of the sedation medicines used during the test).    FOLLOW UP: Our staff will call the number listed on your records 48-72 hours following your procedure to check on you and address any questions or concerns that you may have regarding the information given to you following your procedure. If we do not reach you, we will leave a message.  We will attempt to reach you two times.  During this call, we will ask if you have developed any symptoms of COVID 19. If you develop any symptoms (ie: fever, flu-like symptoms, shortness of breath, cough etc.) before then, please call [phone].  If you test positive for Covid 19 in the 2 weeks post procedure, please call and report this information to [place].    If any biopsies were taken you will be contacted by phone or by letter within the next 1-3 weeks.  Please call us at [phone] if you have not heard about the biopsies in 3 weeks.    SIGNATURES/CONFIDENTIALITY: You and/or your  care partner have signed paperwork which will be entered into your electronic medical record.  These signatures attest to the fact that that the information above on your After Visit Summary has been reviewed and is understood.  Full responsibility of the confidentiality of this discharge information lies with you and/or your care-partner.

## 2019-11-16 NOTE — Progress Notes (Signed)
Report to PACU, RN, vss, BBS= Clear.  

## 2019-11-16 NOTE — Progress Notes (Signed)
Called to room to assist during endoscopic procedure.  Patient ID and intended procedure confirmed with present staff. Received instructions for my participation in the procedure from the performing physician.  

## 2019-11-16 NOTE — Progress Notes (Signed)
Pt's states no medical or surgical changes since previsit or office visit.  Jb - temp DT - vitals

## 2019-11-18 ENCOUNTER — Telehealth: Payer: Self-pay | Admitting: *Deleted

## 2019-11-18 NOTE — Telephone Encounter (Signed)
Attempted follow-up call. Left message on  vm to all if any concerns.

## 2019-11-18 NOTE — Telephone Encounter (Signed)
Message left

## 2019-11-21 ENCOUNTER — Encounter: Payer: Self-pay | Admitting: Gastroenterology

## 2020-01-20 DIAGNOSIS — L309 Dermatitis, unspecified: Secondary | ICD-10-CM | POA: Diagnosis not present

## 2020-01-20 DIAGNOSIS — B351 Tinea unguium: Secondary | ICD-10-CM | POA: Diagnosis not present

## 2020-03-30 DIAGNOSIS — R233 Spontaneous ecchymoses: Secondary | ICD-10-CM | POA: Diagnosis not present

## 2020-03-30 DIAGNOSIS — B351 Tinea unguium: Secondary | ICD-10-CM | POA: Diagnosis not present

## 2020-07-25 DIAGNOSIS — F4322 Adjustment disorder with anxiety: Secondary | ICD-10-CM | POA: Diagnosis not present

## 2020-07-25 DIAGNOSIS — R0600 Dyspnea, unspecified: Secondary | ICD-10-CM | POA: Diagnosis not present

## 2020-07-25 DIAGNOSIS — F5221 Male erectile disorder: Secondary | ICD-10-CM | POA: Diagnosis not present

## 2020-07-25 DIAGNOSIS — M542 Cervicalgia: Secondary | ICD-10-CM | POA: Diagnosis not present

## 2020-07-27 ENCOUNTER — Encounter: Payer: Self-pay | Admitting: Cardiology

## 2020-08-03 ENCOUNTER — Encounter: Payer: Self-pay | Admitting: Cardiology

## 2020-08-03 ENCOUNTER — Ambulatory Visit: Payer: BC Managed Care – PPO | Admitting: Cardiology

## 2020-08-03 ENCOUNTER — Other Ambulatory Visit: Payer: Self-pay

## 2020-08-03 ENCOUNTER — Ambulatory Visit (INDEPENDENT_AMBULATORY_CARE_PROVIDER_SITE_OTHER): Payer: BC Managed Care – PPO

## 2020-08-03 VITALS — BP 124/82 | HR 55 | Ht 69.0 in | Wt 179.2 lb

## 2020-08-03 DIAGNOSIS — E785 Hyperlipidemia, unspecified: Secondary | ICD-10-CM | POA: Diagnosis not present

## 2020-08-03 DIAGNOSIS — R001 Bradycardia, unspecified: Secondary | ICD-10-CM

## 2020-08-03 DIAGNOSIS — I10 Essential (primary) hypertension: Secondary | ICD-10-CM

## 2020-08-03 DIAGNOSIS — K219 Gastro-esophageal reflux disease without esophagitis: Secondary | ICD-10-CM | POA: Diagnosis not present

## 2020-08-03 NOTE — Patient Instructions (Signed)
Medication Instructions:  Your physician recommends that you continue on your current medications as directed. Please refer to the Current Medication list given to you today.  *If you need a refill on your cardiac medications before your next appointment, please call your pharmacy*   Lab Work: None If you have labs (blood work) drawn today and your tests are completely normal, you will receive your results only by: Marland Kitchen MyChart Message (if you have MyChart) OR . A paper copy in the mail If you have any lab test that is abnormal or we need to change your treatment, we will call you to review the results.   Testing/Procedures: A zio monitor was ordered today. It will remain on for 7 days. You will then return monitor and event diary in provided box. It takes 1-2 weeks for report to be downloaded and returned to [place]. We will call you with the results. If monitor falls off or has orange flashing light, please call Zio for further instructions.      Follow-Up: At [facility], you and your health needs are our priority.  As part of our continuing mission to provide you with exceptional heart care, we have created designated Provider Care Teams.  These Care Teams include your primary Cardiologist (physician) and Advanced Practice Providers (APPs -  Physician Assistants and Nurse Practitioners) who all work together to provide you with the care you need, when you need it.  We recommend signing up for the patient portal called "MyChart".  Sign up information is provided on this After Visit Summary.  MyChart is used to connect with patients for Virtual Visits (Telemedicine).  Patients are able to view lab/test results, encounter notes, upcoming appointments, etc.  Non-urgent messages can be sent to your provider as well.   To learn more about what you can do with MyChart, go to [email or web address].    Your next appointment:   1 year(s)  The format for your next appointment:   In  Person  Provider:   Jenne Campus, MD   Other Instructions

## 2020-08-03 NOTE — Progress Notes (Signed)
Cardiology Office Note:    Date:  08/03/2020   ID:  Randy Larson, DOB [birth year], MRN [id number]  PCP:  Serita Grammes, MD  Cardiologist:  Jenne Campus, MD    Referring MD: Serita Grammes, MD   No chief complaint on file. I am doing well  History of Present Illness:    Randy Larson is a 64 y.o. male he was sent to [place] initially because of episode of bradycardia.  There was incidental finding he was completely asymptomatic.  Last time year ago he did wear a monitor Zio patch which shows sinus bradycardia with no AV block.  Slowest heart rate was 40 average heart rate was 66.  He was referred back to [place] for reevaluation.  Overall he seems to be doing well he complained of having sometimes weakness and fatigue and also may be 2 episode of dizziness that happened more than a year.  But otherwise no issues.  No chest pain tightness squeezing pressure burning chest  Past Medical History:  Diagnosis Date  . Atypical chest pain 11/09/2015  . Bilateral chronic knee pain 01/24/2017  . Collapsed lung    X2 in the past  . Contusion of left elbow 07/24/2018  . Dark stools 12/02/2018  . Dyslipidemia 11/09/2015  . Essential hypertension 11/09/2015  . GERD (gastroesophageal reflux disease)   . Hemorrhoids   . History of colonic polyps 12/02/2018  . History of pneumothorax   . Incarcerated hiatal hernia containing stomach, colon, and pancreas 12/02/2018    Past Surgical History:  Procedure Laterality Date  . COLONOSCOPY  02/04/2009   Colonic polyps. Mild sigmoid diverticulosis. Small internal hemorrhoids.   . ESOPHAGEAL MANOMETRY N/A 04/13/2019   Procedure: ESOPHAGEAL MANOMETRY (EM);  Surgeon: Mauri Pole, MD;  Location: [facility];  Service: Endoscopy;  Laterality: N/A;  . ESOPHAGOGASTRODUODENOSCOPY  02/04/2009   Short segment Barretts Esphagus. Paraesophageal hernia. Mild gastritis. Minimal flattening of the duodenal mucosa of questionable importance, biopsied.     Current  Medications: Current Meds  Medication Sig  . escitalopram (LEXAPRO) 20 MG tablet Take 20 mg by mouth at bedtime.  Marland Kitchen lisinopril (ZESTRIL) 10 MG tablet Take 10 mg by mouth daily.      Allergies:   Patient has no known allergies.   Social History   Socioeconomic History  . Marital status: Married    Spouse name: Not on file  . Number of children: Not on file  . Years of education: Not on file  . Highest education level: Not on file  Occupational History  . Not on file  Tobacco Use  . Smoking status: Former Smoker    Types: Cigarettes  . Smokeless tobacco: Never Used  Vaping Use  . Vaping Use: Never used  Substance and Sexual Activity  . Alcohol use: Not Currently    Comment: Rare/special occasion  . Drug use: Not Currently  . Sexual activity: Not on file  Other Topics Concern  . Not on file  Social History Narrative  . Not on file   Social Determinants of Health   Financial Resource Strain:   . Difficulty of Paying Living Expenses: Not on file  Food Insecurity:   . Worried About Charity fundraiser in the Last Year: Not on file  . Ran Out of Food in the Last Year: Not on file  Transportation Needs:   . Lack of Transportation (Medical): Not on file  . Lack of Transportation (Non-Medical): Not on file  Physical Activity:   .  Days of Exercise per Week: Not on file  . Minutes of Exercise per Session: Not on file  Stress:   . Feeling of Stress : Not on file  Social Connections:   . Frequency of Communication with Friends and Family: Not on file  . Frequency of Social Gatherings with Friends and Family: Not on file  . Attends Religious Services: Not on file  . Active Member of Clubs or Organizations: Not on file  . Attends Archivist Meetings: Not on file  . Marital Status: Not on file     Family History: The patient's family history includes Cancer in his brother; Congestive Heart Failure in his father and mother; Diabetes in his mother; Heart disease in  his mother; Hypertension in his mother; Rectal cancer in his brother. There is no history of Esophageal cancer, Colon cancer, or Stomach cancer. ROS:   Please see the history of present illness.    All 14 point review of systems negative except as described per history of present illness  EKGs/Labs/Other Studies Reviewed:      Recent Labs: No results found for requested labs within last 8760 hours.  Recent Lipid Panel No results found for: CHOL, TRIG, HDL, CHOLHDL, VLDL, LDLCALC, LDLDIRECT  Physical Exam:    VS:  BP 124/82   Pulse (!) 55   Ht 5\' 9"  (1.753 m)   Wt 179 lb 3.2 oz (81.3 kg)   SpO2 97%   BMI 26.46 kg/m     Wt Readings from Last 3 Encounters:  08/03/20 179 lb 3.2 oz (81.3 kg)  07/25/20 180 lb (81.6 kg)  11/16/19 177 lb (80.3 kg)     GEN:  Well nourished, well developed in no acute distress HEENT: Normal NECK: No JVD; No carotid bruits LYMPHATICS: No lymphadenopathy CARDIAC: RRR, no murmurs, no rubs, no gallops RESPIRATORY:  Clear to auscultation without rales, wheezing or rhonchi  ABDOMEN: Soft, non-tender, non-distended MUSCULOSKELETAL:  No edema; No deformity  SKIN: Warm and dry LOWER EXTREMITIES: no swelling NEUROLOGIC:  Alert and oriented x 3 PSYCHIATRIC:  Normal affect   ASSESSMENT:    1. Sinus bradycardia   2. Essential hypertension   3. Gastroesophageal reflux disease without esophagitis   4. Dyslipidemia    PLAN:    In order of problems listed above:  1. Sinus bradycardia.  I will ask him to wear Zio patch again for a week to see if there is any worsening of bradycardia during office before, obviously will avoid any sinus node blocking agent. 2. Essential hypertension his blood pressures well controlled 124/82 we will continue present management.  Dyslipidemia: I did review his K PN and this is from September 21, 2019 showing LDL of 64 and HDL 64 it is a good cholesterol profile we will continue present management.   Medication  Adjustments/Labs and Tests Ordered: Current medicines are reviewed at length with the patient today.  Concerns regarding medicines are outlined above.  No orders of the defined types were placed in this encounter.  Medication changes: No orders of the defined types were placed in this encounter.   Signed, Park Liter, MD, [facility] 08/03/2020 4:21 PM    [place]

## 2020-09-01 ENCOUNTER — Telehealth: Payer: Self-pay | Admitting: Cardiology

## 2020-09-01 NOTE — Telephone Encounter (Signed)
Patient returning hayley's call for results, connected call to hayley.

## 2020-09-05 DIAGNOSIS — F4322 Adjustment disorder with anxiety: Secondary | ICD-10-CM | POA: Diagnosis not present

## 2020-09-05 DIAGNOSIS — F5221 Male erectile disorder: Secondary | ICD-10-CM | POA: Diagnosis not present

## 2020-09-05 DIAGNOSIS — Z6824 Body mass index (BMI) 24.0-24.9, adult: Secondary | ICD-10-CM | POA: Diagnosis not present

## 2020-09-05 DIAGNOSIS — I10 Essential (primary) hypertension: Secondary | ICD-10-CM | POA: Diagnosis not present

## 2020-10-05 IMAGING — RF DG ESOPHAGUS
5 series · 20 of 20 positions shown · IV contrast (omnipaque)
Comparison: CT chest from 10/16/2018

CLINICAL DATA: Postop day 1 status post hiatal hernia repair and
Moolman fundoplication

EXAM:
ESOPHOGRAM/BARIUM SWALLOW
TECHNIQUE: Single contrast examination was performed using 75 cc Omnipaque 300.
FLUOROSCOPY TIME:  Fluoroscopy Time:  1 minutes, 12 seconds
Radiation Exposure Index (if provided by the fluoroscopic device):
14.9 mGy
Number of Acquired Spot Images: 0

[Series 1: cp_standard · 0.34mm/px · 4 of 132 frames shown (1 of 5)]
[frame 13/132]
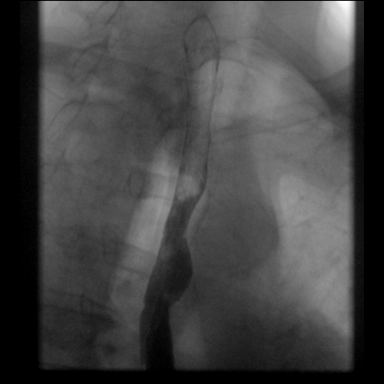
[frame 20/132]
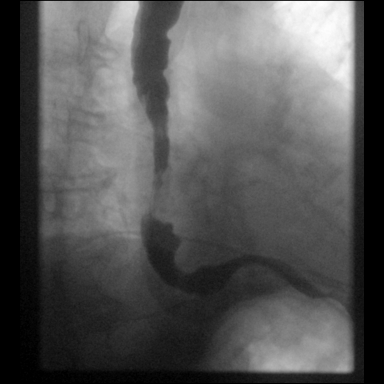
[frame 67/132]
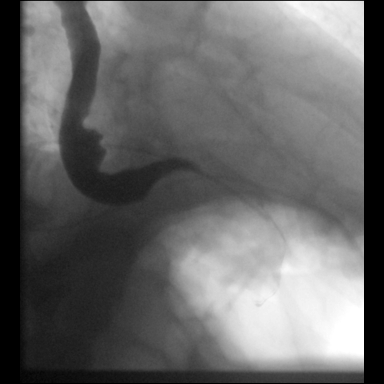
[frame 113/132]
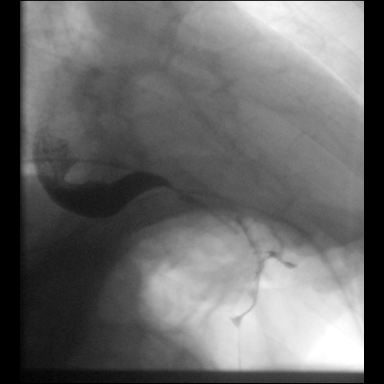

[Series 2: cp_standard · 0.34mm/px · 4 of 55 frames shown (2 of 5)]
[frame 6/55]
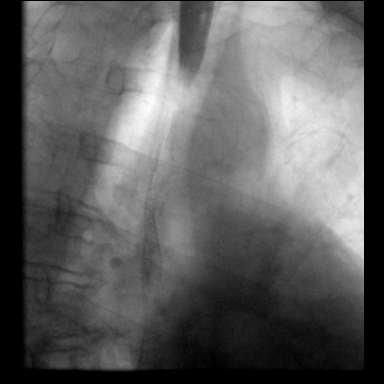
[frame 9/55]
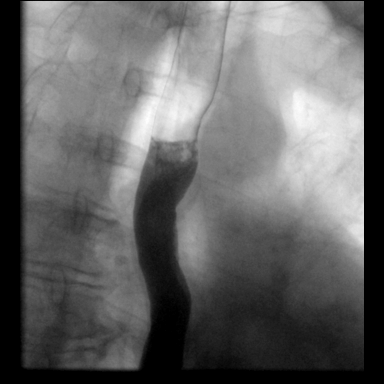
[frame 28/55]
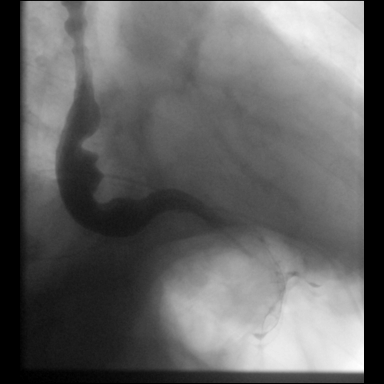
[frame 47/55]
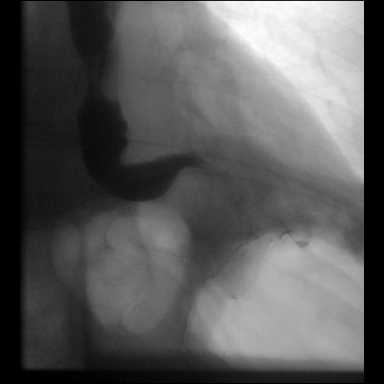

[Series 3: cp_standard · 0.34mm/px · 4 of 22 frames shown (3 of 5)]
[frame 4/22]
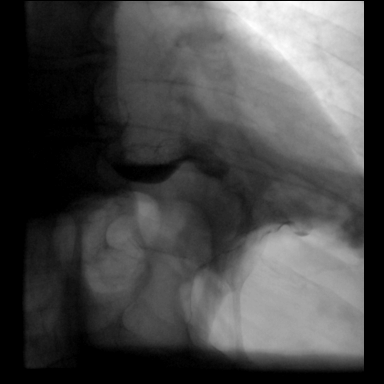
[frame 7/22]
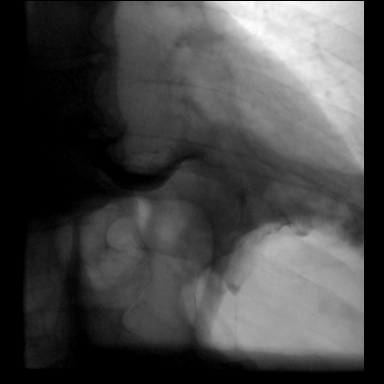
[frame 12/22]
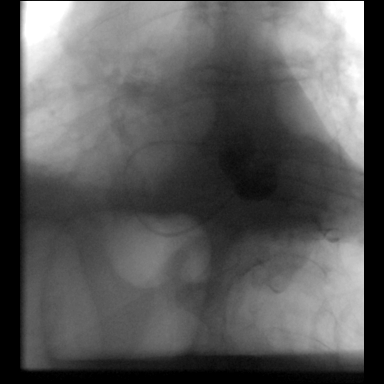
[frame 19/22]
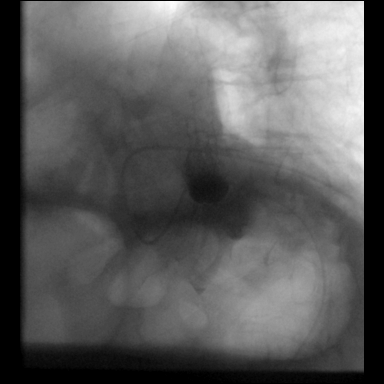

[Series 4: cp_standard · 0.34mm/px · 4 of 62 frames shown (4 of 5)]
[frame 10/62]
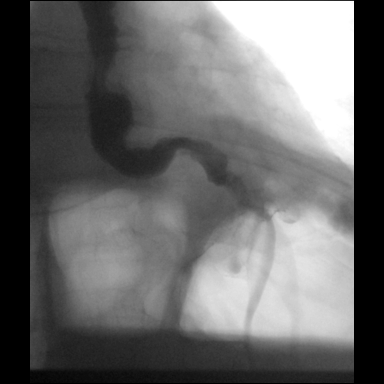
[frame 32/62]
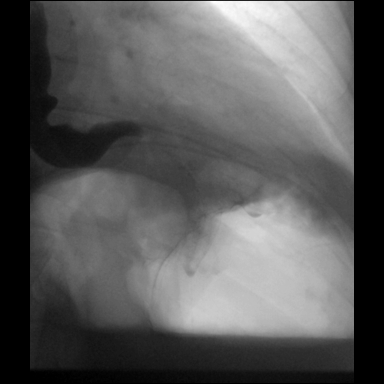
[frame 53/62]
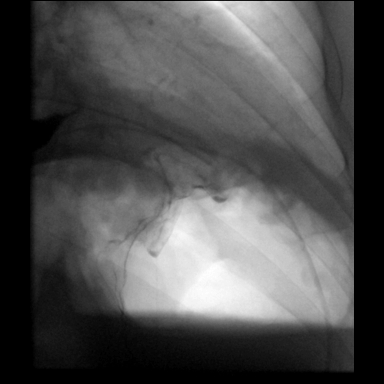
[frame 54/62]
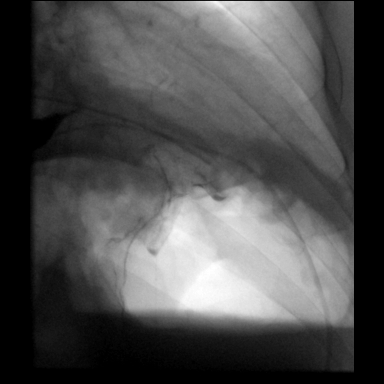

[Series 5: cp_standard · 0.51mm/px · 4 of 23 frames shown (5 of 5)]
[frame 4/23]
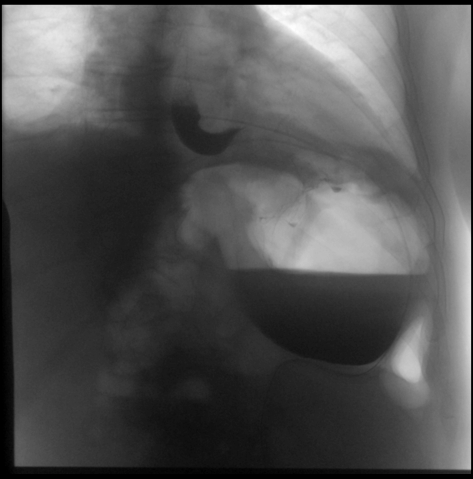
[frame 10/23]
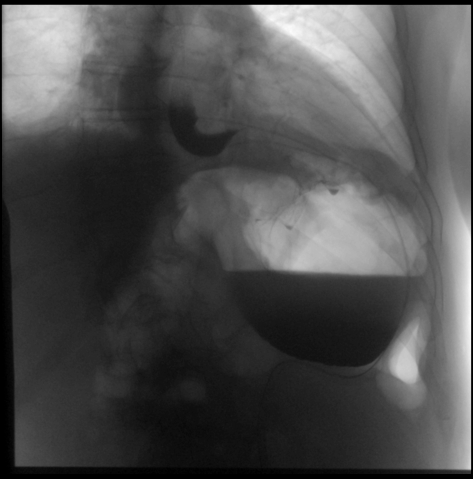
[frame 12/23]
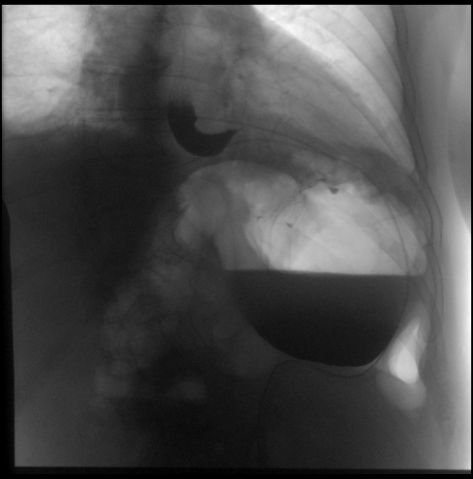
[frame 20/23]
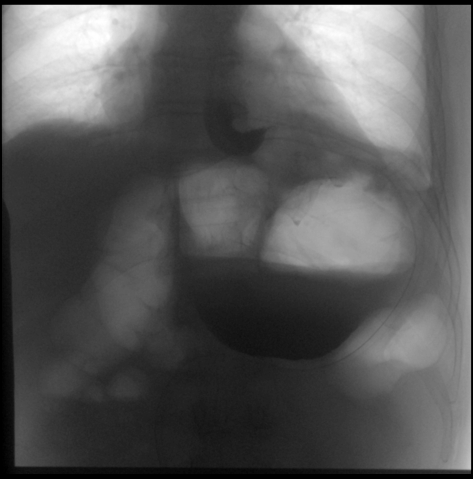

[20 of 20 positions shown; findings below may reference images not displayed]

FINDINGS: The pharyngeal phase of swallowing was not directly assessed.

Primary peristaltic waves in the esophagus terminated in the mid
esophagus with distal esophageal secondary and tertiary
contractions. No leak of contrast is identified. There is some mild
marginal irregularity of the distal esophagus for example as shown
on image [DATE] and image [DATE]. Narrowing in this vicinity, I was only
able to distend this part of the esophagus up to about 6 mm for
example on image [DATE]. A regional drain is in place.
IMPRESSION: 1. No leak is demonstrated.
2. Disruption of primary peristaltic waves in the mid esophagus with
distal secondary and tertiary contractions.
3. Narrowing in the distal esophagus, maximum distended diameter was
about 6 mm. Some of this may be from the fundoplication and local
swelling, although a component may be from nondistention given the
dysmotility.

## 2020-10-26 DIAGNOSIS — Z125 Encounter for screening for malignant neoplasm of prostate: Secondary | ICD-10-CM | POA: Diagnosis not present

## 2020-10-26 DIAGNOSIS — F5221 Male erectile disorder: Secondary | ICD-10-CM | POA: Diagnosis not present

## 2020-10-26 DIAGNOSIS — Z13220 Encounter for screening for lipoid disorders: Secondary | ICD-10-CM | POA: Diagnosis not present

## 2020-10-26 DIAGNOSIS — Z0000 Encounter for general adult medical examination without abnormal findings: Secondary | ICD-10-CM | POA: Diagnosis not present

## 2020-10-26 DIAGNOSIS — Z131 Encounter for screening for diabetes mellitus: Secondary | ICD-10-CM | POA: Diagnosis not present

## 2020-10-26 DIAGNOSIS — Z6824 Body mass index (BMI) 24.0-24.9, adult: Secondary | ICD-10-CM | POA: Diagnosis not present

## 2020-12-08 DIAGNOSIS — M47816 Spondylosis without myelopathy or radiculopathy, lumbar region: Secondary | ICD-10-CM | POA: Diagnosis not present

## 2020-12-08 DIAGNOSIS — G894 Chronic pain syndrome: Secondary | ICD-10-CM | POA: Diagnosis not present

## 2020-12-08 DIAGNOSIS — M533 Sacrococcygeal disorders, not elsewhere classified: Secondary | ICD-10-CM | POA: Insufficient documentation

## 2021-02-03 DIAGNOSIS — Z6827 Body mass index (BMI) 27.0-27.9, adult: Secondary | ICD-10-CM | POA: Diagnosis not present

## 2021-02-03 DIAGNOSIS — R0781 Pleurodynia: Secondary | ICD-10-CM | POA: Diagnosis not present

## 2021-08-28 DIAGNOSIS — Z6823 Body mass index (BMI) 23.0-23.9, adult: Secondary | ICD-10-CM | POA: Diagnosis not present

## 2021-08-28 DIAGNOSIS — B351 Tinea unguium: Secondary | ICD-10-CM | POA: Diagnosis not present

## 2021-08-28 DIAGNOSIS — F5221 Male erectile disorder: Secondary | ICD-10-CM | POA: Diagnosis not present

## 2021-08-28 DIAGNOSIS — F4322 Adjustment disorder with anxiety: Secondary | ICD-10-CM | POA: Diagnosis not present

## 2021-08-28 DIAGNOSIS — I10 Essential (primary) hypertension: Secondary | ICD-10-CM | POA: Diagnosis not present

## 2021-08-28 DIAGNOSIS — M109 Gout, unspecified: Secondary | ICD-10-CM | POA: Diagnosis not present

## 2021-09-11 ENCOUNTER — Ambulatory Visit: Payer: Medicare HMO | Admitting: Podiatry

## 2021-09-11 ENCOUNTER — Other Ambulatory Visit: Payer: Self-pay

## 2021-09-11 ENCOUNTER — Ambulatory Visit (INDEPENDENT_AMBULATORY_CARE_PROVIDER_SITE_OTHER): Payer: Medicare HMO

## 2021-09-11 DIAGNOSIS — L03031 Cellulitis of right toe: Secondary | ICD-10-CM | POA: Diagnosis not present

## 2021-09-11 DIAGNOSIS — S99921A Unspecified injury of right foot, initial encounter: Secondary | ICD-10-CM

## 2021-09-11 NOTE — Progress Notes (Signed)
  Subjective:  Patient ID: Randy Larson, male    DOB: 1955-12-18,  MRN: [id number]  Chief Complaint  Patient presents with   Foot Pain    The    65 y.o. male presents with the above complaint. History confirmed with patient.   Objective:  Physical Exam: warm, good capillary refill, no trophic changes or ulcerative lesions, normal DP and PT pulses, and normal sensory exam. Right Foot: healing nail bed with subungual debris, no warmth or erythema mild fibrinous exudate under nail with small sinus noted.   No images are attached to the encounter.  Radiographs: X-ray of the right foot: no fracture, dislocation, swelling or degenerative changes noted Assessment:   1. Infected nailbed of toe, right   2. Injury of right foot, initial encounter     Plan:  Patient was evaluated and treated and all questions answered.  Right hallux nail infection 2/2 subungual hemorrhage -Nail debrided, small draining sinus noted in nail bed. Cleansed. Abx ointment and band-aid applied. Patient advised to soak daily x1 week and dress with abx ointment. -XR taken and reviewed. No fractures, no signs of bone abnormalities   Return in about 3 weeks (around 10/02/2021) for nail infection.

## 2021-10-05 ENCOUNTER — Ambulatory Visit: Payer: Medicare HMO | Admitting: Podiatry

## 2021-11-21 DIAGNOSIS — N401 Enlarged prostate with lower urinary tract symptoms: Secondary | ICD-10-CM | POA: Diagnosis not present

## 2021-11-21 DIAGNOSIS — Z0000 Encounter for general adult medical examination without abnormal findings: Secondary | ICD-10-CM | POA: Diagnosis not present

## 2021-11-21 DIAGNOSIS — Z125 Encounter for screening for malignant neoplasm of prostate: Secondary | ICD-10-CM | POA: Diagnosis not present

## 2021-11-21 DIAGNOSIS — R351 Nocturia: Secondary | ICD-10-CM | POA: Diagnosis not present

## 2021-11-21 DIAGNOSIS — Z131 Encounter for screening for diabetes mellitus: Secondary | ICD-10-CM | POA: Diagnosis not present

## 2021-11-21 DIAGNOSIS — I471 Supraventricular tachycardia: Secondary | ICD-10-CM | POA: Diagnosis not present

## 2021-11-21 DIAGNOSIS — F4322 Adjustment disorder with anxiety: Secondary | ICD-10-CM | POA: Diagnosis not present

## 2021-11-21 DIAGNOSIS — Z79899 Other long term (current) drug therapy: Secondary | ICD-10-CM | POA: Diagnosis not present

## 2021-11-21 DIAGNOSIS — Z13220 Encounter for screening for lipoid disorders: Secondary | ICD-10-CM | POA: Diagnosis not present

## 2022-02-01 DIAGNOSIS — R351 Nocturia: Secondary | ICD-10-CM | POA: Diagnosis not present

## 2022-02-01 DIAGNOSIS — N401 Enlarged prostate with lower urinary tract symptoms: Secondary | ICD-10-CM | POA: Diagnosis not present

## 2022-05-21 DIAGNOSIS — U099 Post covid-19 condition, unspecified: Secondary | ICD-10-CM | POA: Diagnosis not present

## 2022-05-21 DIAGNOSIS — I10 Essential (primary) hypertension: Secondary | ICD-10-CM | POA: Diagnosis not present

## 2022-05-21 DIAGNOSIS — F4322 Adjustment disorder with anxiety: Secondary | ICD-10-CM | POA: Diagnosis not present

## 2022-05-21 DIAGNOSIS — R351 Nocturia: Secondary | ICD-10-CM | POA: Diagnosis not present

## 2022-05-21 DIAGNOSIS — Z6825 Body mass index (BMI) 25.0-25.9, adult: Secondary | ICD-10-CM | POA: Diagnosis not present

## 2022-05-21 DIAGNOSIS — N401 Enlarged prostate with lower urinary tract symptoms: Secondary | ICD-10-CM | POA: Diagnosis not present

## 2022-05-21 DIAGNOSIS — K219 Gastro-esophageal reflux disease without esophagitis: Secondary | ICD-10-CM | POA: Diagnosis not present

## 2022-08-21 DIAGNOSIS — I10 Essential (primary) hypertension: Secondary | ICD-10-CM | POA: Diagnosis not present

## 2022-08-21 DIAGNOSIS — R141 Gas pain: Secondary | ICD-10-CM | POA: Diagnosis not present

## 2022-08-21 DIAGNOSIS — Z6825 Body mass index (BMI) 25.0-25.9, adult: Secondary | ICD-10-CM | POA: Diagnosis not present

## 2022-08-21 DIAGNOSIS — K219 Gastro-esophageal reflux disease without esophagitis: Secondary | ICD-10-CM | POA: Diagnosis not present

## 2022-08-21 DIAGNOSIS — H6993 Unspecified Eustachian tube disorder, bilateral: Secondary | ICD-10-CM | POA: Diagnosis not present

## 2022-08-21 DIAGNOSIS — R42 Dizziness and giddiness: Secondary | ICD-10-CM | POA: Diagnosis not present

## 2022-08-21 DIAGNOSIS — R55 Syncope and collapse: Secondary | ICD-10-CM | POA: Diagnosis not present

## 2022-10-25 DIAGNOSIS — H9313 Tinnitus, bilateral: Secondary | ICD-10-CM | POA: Diagnosis not present

## 2022-10-25 DIAGNOSIS — H903 Sensorineural hearing loss, bilateral: Secondary | ICD-10-CM | POA: Diagnosis not present

## 2022-10-25 DIAGNOSIS — Z77122 Contact with and (suspected) exposure to noise: Secondary | ICD-10-CM | POA: Diagnosis not present

## 2022-12-17 ENCOUNTER — Encounter: Payer: Self-pay | Admitting: Cardiology

## 2022-12-17 ENCOUNTER — Ambulatory Visit: Payer: PPO | Attending: Cardiology

## 2022-12-17 ENCOUNTER — Ambulatory Visit: Payer: PPO | Attending: Cardiology | Admitting: Cardiology

## 2022-12-17 VITALS — BP 132/74 | HR 52 | Ht 71.0 in | Wt 181.8 lb

## 2022-12-17 DIAGNOSIS — R001 Bradycardia, unspecified: Secondary | ICD-10-CM

## 2022-12-17 DIAGNOSIS — I10 Essential (primary) hypertension: Secondary | ICD-10-CM

## 2022-12-17 DIAGNOSIS — R42 Dizziness and giddiness: Secondary | ICD-10-CM | POA: Diagnosis not present

## 2022-12-17 NOTE — Progress Notes (Signed)
Cardiology Office Note:    Date:  12/17/2022   ID:  Randy Larson, DOB [birth year], MRN [id number]  PCP:  Serita Grammes, MD  Cardiologist:  Jenne Campus, MD    Referring MD: Serita Grammes, MD   Chief Complaint  Patient presents with   Follow-up  I have episode of dizziness  History of Present Illness:    Randy Larson is a 67 y.o. male past medical history significant for sinus bradycardia, essential hypertension, dizziness.  So far he with 2 episodes that did not show any significant bradycardia he comes today after not being seen for a while.  He described to have episode of dizziness and near syncope he never completely passed out, those episodes can happen when he is sitting when he is laying down.  He did not notice any decrease in ability to exercise.  Past Medical History:  Diagnosis Date   Atypical chest pain 11/09/2015   Bilateral chronic knee pain 01/24/2017   Collapsed lung    X2 in the past   Contusion of left elbow 07/24/2018   Dark stools 12/02/2018   Dyslipidemia 11/09/2015   Essential hypertension 11/09/2015   GERD (gastroesophageal reflux disease)    Hemorrhoids    History of colonic polyps 12/02/2018   History of pneumothorax    Incarcerated hiatal hernia containing stomach, colon, and pancreas 12/02/2018   Sacroiliac joint pain 12/08/2020    Past Surgical History:  Procedure Laterality Date   COLONOSCOPY  02/04/2009   Colonic polyps. Mild sigmoid diverticulosis. Small internal hemorrhoids.    ESOPHAGEAL MANOMETRY N/A 04/13/2019   Procedure: ESOPHAGEAL MANOMETRY (EM);  Surgeon: Mauri Pole, MD;  Location: [facility];  Service: Endoscopy;  Laterality: N/A;   ESOPHAGOGASTRODUODENOSCOPY  02/04/2009   Short segment Barretts Esphagus. Paraesophageal hernia. Mild gastritis. Minimal flattening of the duodenal mucosa of questionable importance, biopsied.     Current Medications: Current Meds  Medication Sig   escitalopram (LEXAPRO) 20  MG tablet Take 20 mg by mouth at bedtime.   lisinopril (ZESTRIL) 10 MG tablet Take 10 mg by mouth daily.      Allergies:   Patient has no known allergies.   Social History   Socioeconomic History   Marital status: Married    Spouse name: Not on file   Number of children: Not on file   Years of education: Not on file   Highest education level: Not on file  Occupational History   Not on file  Tobacco Use   Smoking status: Former    Types: Cigarettes   Smokeless tobacco: Never  Vaping Use   Vaping Use: Never used  Substance and Sexual Activity   Alcohol use: Not Currently    Comment: Rare/special occasion   Drug use: Not Currently   Sexual activity: Not on file  Other Topics Concern   Not on file  Social History Narrative   Not on file   Social Determinants of Health   Financial Resource Strain: Not on file  Food Insecurity: Not on file  Transportation Needs: Not on file  Physical Activity: Not on file  Stress: Not on file  Social Connections: Not on file     Family History: The patient's family history includes Cancer in his brother; Congestive Heart Failure in his father and mother; Diabetes in his mother; Heart disease in his mother; Hypertension in his mother; Rectal cancer in his brother. There is no history of Esophageal cancer, Colon cancer, or Stomach cancer. ROS:   Please see  the history of present illness.    All 14 point review of systems negative except as described per history of present illness  EKGs/Labs/Other Studies Reviewed:      Recent Labs: No results found for requested labs within last 365 days.  Recent Lipid Panel No results found for: "CHOL", "TRIG", "HDL", "CHOLHDL", "VLDL", "LDLCALC", "LDLDIRECT"  Physical Exam:    VS:  BP 132/74 (BP Location: Left Arm, Patient Position: Sitting)   Pulse (!) 52   Ht 5\' 11"  (1.803 m)   Wt 181 lb 12.8 oz (82.5 kg)   SpO2 98%   BMI 25.36 kg/m     Wt Readings from Last 3 Encounters:  12/17/22 181  lb 12.8 oz (82.5 kg)  08/03/20 179 lb 3.2 oz (81.3 kg)  07/25/20 180 lb (81.6 kg)     GEN:  Well nourished, well developed in no acute distress HEENT: Normal NECK: No JVD; No carotid bruits LYMPHATICS: No lymphadenopathy CARDIAC: RRR, no murmurs, no rubs, no gallops RESPIRATORY:  Clear to auscultation without rales, wheezing or rhonchi  ABDOMEN: Soft, non-tender, non-distended MUSCULOSKELETAL:  No edema; No deformity  SKIN: Warm and dry LOWER EXTREMITIES: no swelling NEUROLOGIC:  Alert and oriented x 3 PSYCHIATRIC:  Normal affect   ASSESSMENT:    1. Sinus bradycardia   2. Essential hypertension   3. Dizziness    PLAN:    In order of problems listed above:  History of sinus bradycardia with episode of dizziness I asked him to wear Zio patch for a month to see if he can record his EKG in the moment he is feeling dizziness.  He always had sinus bradycardia with no A-V conduction issues.  Will also consider implantable loop recorder if Zio patch is unrevealing. Essential hypertension blood pressure well-controlled continue present management. Dyslipidemia I did review his K PN which show me his LDL 68 HDL 51 it is a good cholesterol profile we will continue present management.   Medication Adjustments/Labs and Tests Ordered: Current medicines are reviewed at length with the patient today.  Concerns regarding medicines are outlined above.  Orders Placed This Encounter  Procedures   LONG TERM MONITOR (3-14 DAYS)   Medication changes: No orders of the defined types were placed in this encounter.   Signed, Park Liter, MD, [facility] 12/17/2022 10:03 AM    [place]

## 2022-12-17 NOTE — Patient Instructions (Addendum)
Medication Instructions:  Your physician recommends that you continue on your current medications as directed. Please refer to the Current Medication list given to you today.  *If you need a refill on your cardiac medications before your next appointment, please call your pharmacy*   Lab Work: None Ordered If you have labs (blood work) drawn today and your tests are completely normal, you will receive your results only by: [place] (if you have MyChart) OR A paper copy in the mail If you have any lab test that is abnormal or we need to change your treatment, we will call you to review the results.   Testing/Procedures:  WHY IS MY DOCTOR PRESCRIBING ZIO?    One Month The Zio system is proven and trusted by physicians to detect and diagnose irregular heart rhythms -- and has been prescribed to hundreds of thousands of patients.  The FDA has cleared the Zio system to monitor for many different kinds of irregular heart rhythms. In a study, physicians were able to reach a diagnosis 90% of the time with the Zio system1.  You can wear the Zio monitor -- a small, discreet, comfortable patch -- during your normal day-to-day activity, including while you sleep, shower, and exercise, while it records every single heartbeat for analysis.  1Barrett, P., et al. Comparison of 24 Hour Holter Monitoring Versus 14 Day Novel Adhesive Patch Electrocardiographic Monitoring. [place], 2014.  ZIO VS. HOLTER MONITORING The Zio monitor can be comfortably worn for up to 14 days. Holter monitors can be worn for 24 to 48 hours, limiting the time to record any irregular heart rhythms you may have. Zio is able to capture data for the 51% of patients who have their first symptom-triggered arrhythmia after 48 hours.1  LIVE WITHOUT RESTRICTIONS The Zio ambulatory cardiac monitor is a small, unobtrusive, and water-resistant patch--you might even forget you're wearing it. The Zio monitor records  and stores every beat of your heart, whether you're sleeping, working out, or showering.     Follow-Up: At [facility], you and your health needs are our priority.  As part of our continuing mission to provide you with exceptional heart care, we have created designated Provider Care Teams.  These Care Teams include your primary Cardiologist (physician) and Advanced Practice Providers (APPs -  Physician Assistants and Nurse Practitioners) who all work together to provide you with the care you need, when you need it.  We recommend signing up for the patient portal called "MyChart".  Sign up information is provided on this After Visit Summary.  MyChart is used to connect with patients for Virtual Visits (Telemedicine).  Patients are able to view lab/test results, encounter notes, upcoming appointments, etc.  Non-urgent messages can be sent to your provider as well.   To learn more about what you can do with MyChart, go to [email or web address].    Your next appointment:   2 month(s)  The format for your next appointment:   In Person  Provider:   Jenne Campus, MD    Other Instructions NA

## 2022-12-25 ENCOUNTER — Telehealth: Payer: Self-pay | Admitting: Cardiology

## 2022-12-25 NOTE — Telephone Encounter (Signed)
Patient states he is wearing a zio monitor and was told he would need 2 done. He says he is not sure if the second one was going to be sent out right away. He would like to know if he can wait until he gets his results from the first monitor, before the second one is sent.

## 2022-12-25 NOTE — Telephone Encounter (Signed)
Spoke with pt - He stated that he got episodes on the 2 week Zio and wants to cancel the 2nd 14 day Zio. Message sent to [facility] to cancel the 2nd Zio.

## 2023-01-04 DIAGNOSIS — R001 Bradycardia, unspecified: Secondary | ICD-10-CM | POA: Diagnosis not present

## 2023-02-04 ENCOUNTER — Telehealth: Payer: Self-pay

## 2023-02-04 NOTE — Telephone Encounter (Signed)
Patient notified through my chart.

## 2023-02-04 NOTE — Telephone Encounter (Signed)
-----   Message from Georgeanna Lea, MD sent at 01/31/2023  8:31 AM EDT ----- Monitor show asymptomatic supraventricular tachycardia, no need to treat

## 2023-02-05 ENCOUNTER — Ambulatory Visit: Payer: PPO | Admitting: Cardiology

## 2023-02-14 ENCOUNTER — Other Ambulatory Visit: Payer: Self-pay

## 2023-02-19 ENCOUNTER — Telehealth: Payer: Self-pay | Admitting: Cardiology

## 2023-02-19 ENCOUNTER — Encounter: Payer: Self-pay | Admitting: Cardiology

## 2023-02-19 ENCOUNTER — Ambulatory Visit: Payer: PPO | Attending: Cardiology | Admitting: Cardiology

## 2023-02-19 VITALS — BP 136/74 | HR 49 | Ht 71.0 in | Wt 181.2 lb

## 2023-02-19 DIAGNOSIS — I10 Essential (primary) hypertension: Secondary | ICD-10-CM | POA: Diagnosis not present

## 2023-02-19 DIAGNOSIS — R0789 Other chest pain: Secondary | ICD-10-CM

## 2023-02-19 DIAGNOSIS — R42 Dizziness and giddiness: Secondary | ICD-10-CM

## 2023-02-19 DIAGNOSIS — R001 Bradycardia, unspecified: Secondary | ICD-10-CM | POA: Diagnosis not present

## 2023-02-19 DIAGNOSIS — R55 Syncope and collapse: Secondary | ICD-10-CM | POA: Diagnosis not present

## 2023-02-19 NOTE — Telephone Encounter (Signed)
Patient is requesting to speak with a nurse regarding his appt today.

## 2023-02-19 NOTE — Telephone Encounter (Signed)
Letter for [facility] sent to pt via My Chart

## 2023-02-19 NOTE — Patient Instructions (Signed)

## 2023-02-19 NOTE — Progress Notes (Unsigned)
Cardiology Office Note:    Date:  02/19/2023   ID:  TEJA ARREOLA, DOB [birth year], MRN [id number]  PCP:  Buckner Malta, MD  Cardiologist:  Gypsy Balsam, MD    Referring MD: Buckner Malta, MD   Chief Complaint  Patient presents with   Follow-up    History of Present Illness:    Randy Larson is a 67 y.o. male with past medical history significant for sinus bradycardia, essential hypertension, dizziness.  He came back to [place] in March complaining of having some episode of near syncope of course concerned about potentially having significant bradycardia.  Monitor has been placed on him.,  However, monitor did not show any bradycardia arrhythmia only some tachycardias likely he did not have any symptoms while he was wearing monitor.  Interestingly couple weeks after he removed the monitor he had episode evening time he started feeling dizzy and that the sensation lasted for about 20 minutes.  He did not passed out he did not have any palpitations.  He just sweat and felt dizzy.  Since that time he is doing fine  Past Medical History:  Diagnosis Date   Atypical chest pain 11/09/2015   Bilateral chronic knee pain 01/24/2017   Collapsed lung    X2 in the past   Contusion of left elbow 07/24/2018   Dark stools 12/02/2018   Dyslipidemia 11/09/2015   Essential hypertension 11/09/2015   GERD (gastroesophageal reflux disease)    Hemorrhoids    History of colonic polyps 12/02/2018   History of pneumothorax    Incarcerated hiatal hernia containing stomach, colon, and pancreas 12/02/2018   Sacroiliac joint pain 12/08/2020    Past Surgical History:  Procedure Laterality Date   COLONOSCOPY  02/04/2009   Colonic polyps. Mild sigmoid diverticulosis. Small internal hemorrhoids.    ESOPHAGEAL MANOMETRY N/A 04/13/2019   Procedure: ESOPHAGEAL MANOMETRY (EM);  Surgeon: Napoleon Form, MD;  Location: [facility];  Service: Endoscopy;  Laterality: N/A;   ESOPHAGOGASTRODUODENOSCOPY   02/04/2009   Short segment Barretts Esphagus. Paraesophageal hernia. Mild gastritis. Minimal flattening of the duodenal mucosa of questionable importance, biopsied.     Current Medications: Current Meds  Medication Sig   escitalopram (LEXAPRO) 20 MG tablet Take 20 mg by mouth at bedtime.   lisinopril (ZESTRIL) 10 MG tablet Take 10 mg by mouth daily.    [DISCONTINUED] sildenafil (VIAGRA) 50 MG tablet Take 50 mg by mouth as needed for erectile dysfunction.     Allergies:   Patient has no known allergies.   Social History   Socioeconomic History   Marital status: Married    Spouse name: Not on file   Number of children: Not on file   Years of education: Not on file   Highest education level: Not on file  Occupational History   Not on file  Tobacco Use   Smoking status: Former    Types: Cigarettes   Smokeless tobacco: Never  Vaping Use   Vaping Use: Never used  Substance and Sexual Activity   Alcohol use: Not Currently    Comment: Rare/special occasion   Drug use: Not Currently   Sexual activity: Not on file  Other Topics Concern   Not on file  Social History Narrative   Not on file   Social Determinants of Health   Financial Resource Strain: Not on file  Food Insecurity: Not on file  Transportation Needs: Not on file  Physical Activity: Not on file  Stress: Not on file  Social Connections: Not  on file     Family History: The patient's family history includes Cancer in his brother; Congestive Heart Failure in his father and mother; Diabetes in his mother; Heart disease in his mother; Hypertension in his mother; Rectal cancer in his brother. There is no history of Esophageal cancer, Colon cancer, or Stomach cancer. ROS:   Please see the history of present illness.    All 14 point review of systems negative except as described per history of present illness  EKGs/Labs/Other Studies Reviewed:      Recent Labs: No results found for requested labs within last 365  days.  Recent Lipid Panel No results found for: "CHOL", "TRIG", "HDL", "CHOLHDL", "VLDL", "LDLCALC", "LDLDIRECT"  Physical Exam:    VS:  BP 136/74 (BP Location: Left Arm, Patient Position: Sitting)   Pulse (!) 49   Ht 5\' 11"  (1.803 m)   Wt 181 lb 3.2 oz (82.2 kg)   SpO2 96%   BMI 25.27 kg/m     Wt Readings from Last 3 Encounters:  02/19/23 181 lb 3.2 oz (82.2 kg)  12/17/22 181 lb 12.8 oz (82.5 kg)  08/03/20 179 lb 3.2 oz (81.3 kg)     GEN:  Well nourished, well developed in no acute distress HEENT: Normal NECK: No JVD; No carotid bruits LYMPHATICS: No lymphadenopathy CARDIAC: RRR, no murmurs, no rubs, no gallops RESPIRATORY:  Clear to auscultation without rales, wheezing or rhonchi  ABDOMEN: Soft, non-tender, non-distended MUSCULOSKELETAL:  No edema; No deformity  SKIN: Warm and dry LOWER EXTREMITIES: no swelling NEUROLOGIC:  Alert and oriented x 3 PSYCHIATRIC:  Normal affect   ASSESSMENT:    1. Sinus bradycardia   2. Dizziness   3. Atypical chest pain   4. Essential hypertension   5. Near syncope    PLAN:    In order of problems listed above:  Episodes of dizziness with questionable near syncope.  We discussed options for this I recommended either putting another monitor on him versus implantable loop recorder, he is reluctant.  We discussed Apple Watch versus Kardia mobile device.  I think he is going to purchase Kardia mobile.  I told him if he passed out or if he gets significantly dizzy he need to let me know right away.  And then I will insist on either getting another monitor implantable loop recorder. Atypical chest pain denies having any.  He said he can walk climb stairs with no difficulties. Essential hypertension blood pressure well-controlled continue present management. Dyslipidemia I did review his K PN which show me total cholesterol 153 HDL 51.  Will continue present management   Medication Adjustments/Labs and Tests Ordered: Current medicines are  reviewed at length with the patient today.  Concerns regarding medicines are outlined above.  No orders of the defined types were placed in this encounter.  Medication changes: No orders of the defined types were placed in this encounter.   Signed, Georgeanna Lea, MD, [facility] 02/19/2023 10:44 AM    [facility]

## 2023-05-01 DIAGNOSIS — F5221 Male erectile disorder: Secondary | ICD-10-CM | POA: Diagnosis not present

## 2023-05-01 DIAGNOSIS — Z1339 Encounter for screening examination for other mental health and behavioral disorders: Secondary | ICD-10-CM | POA: Diagnosis not present

## 2023-05-01 DIAGNOSIS — F4322 Adjustment disorder with anxiety: Secondary | ICD-10-CM | POA: Diagnosis not present

## 2023-05-01 DIAGNOSIS — R7302 Impaired glucose tolerance (oral): Secondary | ICD-10-CM | POA: Diagnosis not present

## 2023-05-01 DIAGNOSIS — Z1331 Encounter for screening for depression: Secondary | ICD-10-CM | POA: Diagnosis not present

## 2023-05-01 DIAGNOSIS — Z6825 Body mass index (BMI) 25.0-25.9, adult: Secondary | ICD-10-CM | POA: Diagnosis not present

## 2023-05-01 DIAGNOSIS — I10 Essential (primary) hypertension: Secondary | ICD-10-CM | POA: Diagnosis not present

## 2023-05-01 DIAGNOSIS — Z0000 Encounter for general adult medical examination without abnormal findings: Secondary | ICD-10-CM | POA: Diagnosis not present

## 2023-05-01 DIAGNOSIS — Z79899 Other long term (current) drug therapy: Secondary | ICD-10-CM | POA: Diagnosis not present

## 2023-05-01 DIAGNOSIS — R001 Bradycardia, unspecified: Secondary | ICD-10-CM | POA: Diagnosis not present

## 2023-05-01 DIAGNOSIS — Z13220 Encounter for screening for lipoid disorders: Secondary | ICD-10-CM | POA: Diagnosis not present

## 2023-05-01 DIAGNOSIS — Z125 Encounter for screening for malignant neoplasm of prostate: Secondary | ICD-10-CM | POA: Diagnosis not present

## 2023-05-16 ENCOUNTER — Telehealth: Payer: Self-pay | Admitting: Cardiology

## 2023-05-16 NOTE — Telephone Encounter (Signed)
Pt called in stating he is trying to send his kardia mobile results via mychart but unsuccessful. He wants to know if he can come by the office to show someone.

## 2023-05-16 NOTE — Telephone Encounter (Signed)
Pt will be coming by office on Monday to show Dr. Hedy Camara strips.

## 2023-05-17 NOTE — Telephone Encounter (Signed)
Patient wants a call back directly from RN Deedee.

## 2023-05-24 ENCOUNTER — Ambulatory Visit: Payer: PPO | Admitting: Cardiology

## 2023-06-18 DIAGNOSIS — L570 Actinic keratosis: Secondary | ICD-10-CM | POA: Diagnosis not present

## 2023-06-18 DIAGNOSIS — L821 Other seborrheic keratosis: Secondary | ICD-10-CM | POA: Diagnosis not present

## 2023-06-18 DIAGNOSIS — L814 Other melanin hyperpigmentation: Secondary | ICD-10-CM | POA: Diagnosis not present

## 2023-06-18 DIAGNOSIS — L578 Other skin changes due to chronic exposure to nonionizing radiation: Secondary | ICD-10-CM | POA: Diagnosis not present

## 2023-11-27 DIAGNOSIS — Z6825 Body mass index (BMI) 25.0-25.9, adult: Secondary | ICD-10-CM | POA: Diagnosis not present

## 2023-11-27 DIAGNOSIS — R7302 Impaired glucose tolerance (oral): Secondary | ICD-10-CM | POA: Diagnosis not present

## 2023-11-27 DIAGNOSIS — H6993 Unspecified Eustachian tube disorder, bilateral: Secondary | ICD-10-CM | POA: Diagnosis not present

## 2023-11-27 DIAGNOSIS — R001 Bradycardia, unspecified: Secondary | ICD-10-CM | POA: Diagnosis not present

## 2023-11-27 DIAGNOSIS — R55 Syncope and collapse: Secondary | ICD-10-CM | POA: Diagnosis not present

## 2023-11-27 DIAGNOSIS — F4322 Adjustment disorder with anxiety: Secondary | ICD-10-CM | POA: Diagnosis not present

## 2023-11-27 DIAGNOSIS — Z79899 Other long term (current) drug therapy: Secondary | ICD-10-CM | POA: Diagnosis not present

## 2023-11-27 DIAGNOSIS — R42 Dizziness and giddiness: Secondary | ICD-10-CM | POA: Diagnosis not present

## 2024-01-08 DIAGNOSIS — F5104 Psychophysiologic insomnia: Secondary | ICD-10-CM | POA: Diagnosis not present

## 2024-01-08 DIAGNOSIS — R42 Dizziness and giddiness: Secondary | ICD-10-CM | POA: Diagnosis not present

## 2024-01-08 DIAGNOSIS — R3915 Urgency of urination: Secondary | ICD-10-CM | POA: Diagnosis not present

## 2024-01-08 DIAGNOSIS — R55 Syncope and collapse: Secondary | ICD-10-CM | POA: Diagnosis not present

## 2024-01-08 DIAGNOSIS — Z6825 Body mass index (BMI) 25.0-25.9, adult: Secondary | ICD-10-CM | POA: Diagnosis not present

## 2024-01-08 DIAGNOSIS — F4322 Adjustment disorder with anxiety: Secondary | ICD-10-CM | POA: Diagnosis not present

## 2024-04-04 DIAGNOSIS — R0600 Dyspnea, unspecified: Secondary | ICD-10-CM | POA: Diagnosis not present

## 2024-04-04 DIAGNOSIS — R071 Chest pain on breathing: Secondary | ICD-10-CM | POA: Diagnosis not present

## 2024-04-21 DIAGNOSIS — R001 Bradycardia, unspecified: Secondary | ICD-10-CM | POA: Diagnosis not present

## 2024-05-15 DIAGNOSIS — H9193 Unspecified hearing loss, bilateral: Secondary | ICD-10-CM | POA: Diagnosis not present

## 2024-05-15 DIAGNOSIS — H9209 Otalgia, unspecified ear: Secondary | ICD-10-CM | POA: Diagnosis not present

## 2024-10-16 ENCOUNTER — Other Ambulatory Visit: Payer: Self-pay

## 2024-11-03 ENCOUNTER — Ambulatory Visit: Admitting: *Deleted

## 2024-11-03 VITALS — Ht 71.0 in | Wt 180.0 lb

## 2024-11-03 DIAGNOSIS — Z860100 Personal history of colon polyps, unspecified: Secondary | ICD-10-CM

## 2024-11-03 MED ORDER — NA SULFATE-K SULFATE-MG SULF 17.5-3.13-1.6 GM/177ML PO SOLN: 1.0000 | Freq: Once | ORAL | 0 refills | Status: AC

## 2024-11-03 NOTE — Progress Notes (Signed)
 Pt's name and DOB verified at the beginning of the pre-visit with 2 identifiers  Pt denies any difficulty with ambulating,sitting, laying down or rolling side to side  Pt has no issues moving head neck or swallowing  No egg or soy allergy known to patient   No issues known to pt with past sedation  No FH of Malignant Hyperthermia  Pt is not on home 02   Pt is not on blood thinners   Pt denies issues with constipation   Pt is not on dialysis  Phx of sinsu bradycardia  Pt denies any upcoming cardiac testing  Patient's chart reviewed by Norleen Schillings CNRA prior to pre-visit and patient appropriate for the [facility].  Pre-visit completed and red dot placed by patient's name on their procedure day (on provider's schedule).    Visit by phone  Pt states weight is 180 lb   Pt given  both [facility] main # and MD on call # prior to instructions.  Informed pt to come in at the time discussed and is shown on PV instructions.  Pt instructed to use [employer] or GoodRx for a price reduction on prep  Instructed pt where to find PV instructions in My Chart. . Instructed pt on all aspects of written instructions including med holds clothing to wear and foods to eat and not eat as well as after procedure legal restrictions and to call MD on call if needed.. Pt states understanding. Instructed pt to review instructions again prior to procedure and call main # given if has any questions or any issues. Pt states they will.

## 2024-11-04 ENCOUNTER — Encounter: Payer: Self-pay | Admitting: Gastroenterology

## 2024-11-17 ENCOUNTER — Encounter: Admitting: Gastroenterology
